# Patient Record
Sex: Female | Born: 1955
Health system: Southern US, Community
[De-identification: ages and names within clinical notes are randomized; demographics above are authoritative.]

## PROBLEM LIST (undated history)

## (undated) DIAGNOSIS — C801 Malignant (primary) neoplasm, unspecified: Secondary | ICD-10-CM

## (undated) DIAGNOSIS — C49A0 Gastrointestinal stromal tumor, unspecified site: Secondary | ICD-10-CM

## (undated) DIAGNOSIS — Q8500 Neurofibromatosis, unspecified: Secondary | ICD-10-CM

## (undated) DIAGNOSIS — E7800 Pure hypercholesterolemia, unspecified: Secondary | ICD-10-CM

## (undated) DIAGNOSIS — K219 Gastro-esophageal reflux disease without esophagitis: Secondary | ICD-10-CM

## (undated) DIAGNOSIS — R51 Headache: Secondary | ICD-10-CM

## (undated) DIAGNOSIS — H5440 Blindness, one eye, unspecified eye: Secondary | ICD-10-CM

## (undated) HISTORY — DX: Gastrointestinal stromal tumor, unspecified site: C49.A0

## (undated) HISTORY — PX: ESOPHAGOGASTRODUODENOSCOPY: SHX1529

## (undated) HISTORY — PX: EYE SURGERY: SHX253

## (undated) HISTORY — DX: Neurofibromatosis, unspecified: Q85.00

## (undated) HISTORY — PX: OTHER SURGICAL HISTORY: SHX169

## (undated) HISTORY — PX: FACIAL RECONSTRUCTION SURGERY: SHX631

---

## 2002-08-05 ENCOUNTER — Encounter: Payer: Self-pay | Admitting: Internal Medicine

## 2002-08-05 ENCOUNTER — Ambulatory Visit (HOSPITAL_COMMUNITY): Admission: RE | Admit: 2002-08-05 | Discharge: 2002-08-05 | Payer: Self-pay | Admitting: Internal Medicine

## 2004-07-21 ENCOUNTER — Other Ambulatory Visit: Admission: RE | Admit: 2004-07-21 | Discharge: 2004-07-21 | Payer: Self-pay | Admitting: Unknown Physician Specialty

## 2004-12-06 ENCOUNTER — Ambulatory Visit (HOSPITAL_COMMUNITY): Admission: RE | Admit: 2004-12-06 | Discharge: 2004-12-06 | Payer: Self-pay | Admitting: Family Medicine

## 2007-01-16 ENCOUNTER — Ambulatory Visit (HOSPITAL_COMMUNITY): Admission: RE | Admit: 2007-01-16 | Discharge: 2007-01-16 | Payer: Self-pay | Admitting: Family Medicine

## 2009-06-29 ENCOUNTER — Ambulatory Visit (HOSPITAL_COMMUNITY): Admission: RE | Admit: 2009-06-29 | Discharge: 2009-06-29 | Payer: Self-pay | Admitting: Family Medicine

## 2009-07-02 ENCOUNTER — Ambulatory Visit (HOSPITAL_COMMUNITY): Admission: RE | Admit: 2009-07-02 | Discharge: 2009-07-02 | Payer: Self-pay | Admitting: Family Medicine

## 2011-04-20 ENCOUNTER — Telehealth: Payer: Self-pay

## 2011-04-20 NOTE — Telephone Encounter (Signed)
Gastroenterology Pre-Procedure Form  Request Date: 04/20/2011       Requesting Physician: Justin Mend, PA-C   PATIENT INFORMATION:  Stephanie [name] is a 55 y.o., female (DOB=1956/06/11).  PROCEDURE: Procedure(s) requested: colonoscopy Procedure Reason: screening for colon cancer  PATIENT REVIEW QUESTIONS: The patient reports the following:   1. Diabetes Melitis: no 2. Joint replacements in the past 12 months: no 3. Major health problems in the past 3 months: no 4. Has an artificial valve or MVP:no 5. Has been advised in past to take antibiotics in advance of a procedure like teeth cleaning: no}    MEDICATIONS & ALLERGIES:    Patient reports the following regarding taking any blood thinners:   Plavix? no Aspirin?yes  Coumadin?  no  Patient confirms/reports the following medications:  Current Outpatient Prescriptions  Medication Sig Dispense Refill  . acetaminophen (TYLENOL) 500 MG tablet Take 500 mg by mouth every 6 (six) hours as needed. As needed       . aspirin 81 MG tablet Take 81 mg by mouth daily.        . Omega-3 Fatty Acids (FISH OIL) 1000 MG CAPS Take by mouth 2 (two) times daily.        . sertraline (ZOLOFT) 50 MG tablet Take 50 mg by mouth daily.        . simvastatin (ZOCOR) 40 MG tablet Take 40 mg by mouth at bedtime.        Marland Kitchen UNKNOWN TO PATIENT Calcium 600 mg plus D    One tablet twice daily         Patient confirms/reports the following allergies:  No Known Allergies  Patient is appropriate to schedule for requested procedure(s): yes  AUTHORIZATION INFORMATION Primary Insurance:  ID #:   Group #:  Pre-Cert / Auth required: Pre-Cert / Auth #:   Secondary Insurance:   ID #:   Group  Pre-Cert / Auth required: Pre-Cert / Auth #:   No orders of the defined types were placed in this encounter.    SCHEDULE INFORMATION: Procedure has been scheduled as follows:  Date: 05/08/2011  Location: [facility] Short Stay This Gastroenterology  Pre-Precedure Form is being routed to the following provider(s) for review: R. Roetta Sessions, MD

## 2011-04-20 NOTE — Telephone Encounter (Signed)
OK for colonoscopy.  

## 2011-04-24 ENCOUNTER — Other Ambulatory Visit: Payer: Self-pay

## 2011-04-24 DIAGNOSIS — Z139 Encounter for screening, unspecified: Secondary | ICD-10-CM

## 2011-04-24 NOTE — Telephone Encounter (Signed)
Rx and instructions mailed.  

## 2011-05-08 ENCOUNTER — Encounter (HOSPITAL_COMMUNITY): Payer: Self-pay | Admitting: *Deleted

## 2011-05-08 ENCOUNTER — Ambulatory Visit (HOSPITAL_COMMUNITY)
Admission: RE | Admit: 2011-05-08 | Discharge: 2011-05-08 | Disposition: A | Discharge status: Home / Self Care | Payer: BC Managed Care – PPO | Source: Ambulatory Visit | Attending: Internal Medicine | Admitting: Internal Medicine

## 2011-05-08 ENCOUNTER — Encounter (HOSPITAL_COMMUNITY)
Admission: RE | Disposition: A | Discharge status: Home / Self Care | Payer: Self-pay | Source: Ambulatory Visit | Attending: Internal Medicine

## 2011-05-08 DIAGNOSIS — E7800 Pure hypercholesterolemia, unspecified: Secondary | ICD-10-CM | POA: Insufficient documentation

## 2011-05-08 DIAGNOSIS — Z1211 Encounter for screening for malignant neoplasm of colon: Secondary | ICD-10-CM

## 2011-05-08 DIAGNOSIS — Z7982 Long term (current) use of aspirin: Secondary | ICD-10-CM | POA: Insufficient documentation

## 2011-05-08 DIAGNOSIS — Z139 Encounter for screening, unspecified: Secondary | ICD-10-CM

## 2011-05-08 HISTORY — DX: Pure hypercholesterolemia, unspecified: E78.00

## 2011-05-08 HISTORY — DX: Gastro-esophageal reflux disease without esophagitis: K21.9

## 2011-05-08 HISTORY — DX: Headache: R51

## 2011-05-08 HISTORY — PX: COLONOSCOPY: SHX5424

## 2011-05-08 SURGERY — COLONOSCOPY
Anesthesia: Moderate Sedation

## 2011-05-08 MED ORDER — MEPERIDINE HCL 100 MG/ML IJ SOLN
INTRAMUSCULAR | Status: DC | PRN
  Administered 2011-05-08: 25 mg via INTRAVENOUS
  Administered 2011-05-08: 50 mg via INTRAVENOUS
  Administered 2011-05-08: 25 mg via INTRAVENOUS

## 2011-05-08 MED ORDER — SODIUM CHLORIDE 0.45 % IV SOLN
Freq: Once | INTRAVENOUS | Status: AC
  Administered 2011-05-08: 10:00:00 via INTRAVENOUS

## 2011-05-08 MED ORDER — MEPERIDINE HCL 100 MG/ML IJ SOLN
INTRAMUSCULAR | Status: AC
  Filled 2011-05-08: qty 2

## 2011-05-08 MED ORDER — MIDAZOLAM HCL 5 MG/5ML IJ SOLN
INTRAMUSCULAR | Status: DC | PRN
  Administered 2011-05-08: 1 mg via INTRAVENOUS
  Administered 2011-05-08 (×2): 2 mg via INTRAVENOUS

## 2011-05-08 MED ORDER — MIDAZOLAM HCL 5 MG/5ML IJ SOLN
INTRAMUSCULAR | Status: AC
  Filled 2011-05-08: qty 10

## 2011-05-08 MED ORDER — STERILE WATER FOR IRRIGATION IR SOLN
Status: DC | PRN
  Administered 2011-05-08: 10:00:00

## 2011-05-08 NOTE — H&P (Signed)
Primary Care Physician:  Cassell Smiles., MD Primary Gastroenterologist:  Dr. Jena Gauss  Pre-Procedure History & Physical: HPI:  Stephanie [name] is a 55 y.o. female is here for a screening colonoscopy. No bowel symptoms. No family history polyps or colon cancer. Had a negative colonoscopy back in 2001  Past Medical History  Diagnosis Date  . Hypercholesteremia   . Headache   . Anemia   . GERD (gastroesophageal reflux disease)   . Depression     Past Surgical History  Procedure Date  . Facial reconstruction surgery     X 5-6 times  . Skin grafts     bilateral legs  . Cesarean section     X 3    Prior to Admission medications   Medication Sig Start Date End Date Taking? Authorizing Provider  acetaminophen (TYLENOL) 500 MG tablet Take 500 mg by mouth every 6 (six) hours as needed. For pain   Yes Historical Provider, MD  aspirin 81 MG tablet Take 81 mg by mouth daily.     Yes Historical Provider, MD  calcium citrate-vitamin D 200-200 MG-UNIT TABS Take 1 tablet by mouth daily.     Yes Historical Provider, MD  Omega-3 Fatty Acids (FISH OIL) 1000 MG CAPS Take by mouth 2 (two) times daily.     Yes Historical Provider, MD  sertraline (ZOLOFT) 50 MG tablet Take 50 mg by mouth daily.     Yes Historical Provider, MD  simvastatin (ZOCOR) 40 MG tablet Take 40 mg by mouth at bedtime.     Yes Historical Provider, MD  UNKNOWN TO PATIENT Calcium 600 mg plus D    One tablet twice daily     Historical Provider, MD    Allergies as of 04/24/2011  . (No Known Allergies)    History reviewed. No pertinent family history.  History   Social History  . Marital Status: Widowed    Spouse Name: N/A    Number of Children: N/A  . Years of Education: N/A   Occupational History  . Not on file.   Social History Main Topics  . Smoking status: Never Smoker   . Smokeless tobacco: Not on file  . Alcohol Use: Yes     occasionally  . Drug Use: No  . Sexually Active:    Other Topics Concern  .  Not on file   Social History Narrative  . No narrative on file    Review of Systems: See HPI, otherwise negative ROS  Physical Exam: BP 116/68  Pulse 70  Temp(Src) 97.9 F (36.6 C) (Oral)  Resp 20  Ht 4\' 11"  (1.499 m)  Wt 121 lb (54.885 kg)  BMI 24.44 kg/m2  SpO2 100% General:   Alert,  Well-developed, well-nourished, pleasant and cooperative in NAD Head: Deformity of left side of face. No left eye   Sclera clear, no icterus.   Conjunctiva pink. Ears:  Normal auditory acuity. Nose:  No deformity, discharge,  or lesions. Mouth:  No deformity or lesions, dentition normal. Neck:  Supple; no masses or thyromegaly. Lungs:  Clear throughout to auscultation.   No wheezes, crackles, or rhonchi. No acute distress. Heart:  Regular rate and rhythm; no murmurs, clicks, rubs,  or gallops. Abdomen:  Soft, nontender and nondistended. No masses, hepatosplenomegaly or hernias noted. Normal bowel sounds, without guarding, and without rebound.   Msk:  Symmetrical without gross deformities. Normal posture. Pulses:  Normal pulses noted. Extremities:  Without clubbing or edema. Neurologic:  Alert and  oriented x4;  grossly normal neurologically. Skin:  Intact without significant lesions or rashes. Cervical Nodes:  No significant cervical adenopathy. Psych:  Alert and cooperative. Normal mood and affect.  Impression/Plan: Stephanie [name] is now here to undergo a screening colonoscopy.  Average risk examination.  Risks, benefits, limitations, imponderables and alternatives regarding colonoscopy have been reviewed with the patient. Questions have been answered. All parties agreeable.

## 2011-05-12 ENCOUNTER — Encounter (HOSPITAL_COMMUNITY): Payer: Self-pay | Admitting: Internal Medicine

## 2011-05-22 ENCOUNTER — Other Ambulatory Visit (HOSPITAL_COMMUNITY): Payer: Self-pay | Admitting: Internal Medicine

## 2011-05-22 DIAGNOSIS — Z139 Encounter for screening, unspecified: Secondary | ICD-10-CM

## 2011-05-25 ENCOUNTER — Ambulatory Visit (HOSPITAL_COMMUNITY): Payer: BC Managed Care – PPO

## 2011-06-15 ENCOUNTER — Ambulatory Visit (HOSPITAL_COMMUNITY)
Admission: RE | Admit: 2011-06-15 | Discharge: 2011-06-15 | Disposition: A | Discharge status: Home / Self Care | Payer: BC Managed Care – PPO | Source: Ambulatory Visit | Attending: Internal Medicine | Admitting: Internal Medicine

## 2011-06-15 DIAGNOSIS — Z1231 Encounter for screening mammogram for malignant neoplasm of breast: Secondary | ICD-10-CM | POA: Insufficient documentation

## 2011-06-15 DIAGNOSIS — Z139 Encounter for screening, unspecified: Secondary | ICD-10-CM

## 2011-06-19 ENCOUNTER — Other Ambulatory Visit (HOSPITAL_COMMUNITY): Payer: Self-pay | Admitting: Internal Medicine

## 2011-06-19 DIAGNOSIS — R921 Mammographic calcification found on diagnostic imaging of breast: Secondary | ICD-10-CM

## 2011-06-19 DIAGNOSIS — Z09 Encounter for follow-up examination after completed treatment for conditions other than malignant neoplasm: Secondary | ICD-10-CM

## 2011-07-12 ENCOUNTER — Ambulatory Visit (HOSPITAL_COMMUNITY)
Admission: RE | Admit: 2011-07-12 | Discharge: 2011-07-12 | Disposition: A | Discharge status: Home / Self Care | Payer: BC Managed Care – PPO | Source: Ambulatory Visit | Attending: Internal Medicine | Admitting: Internal Medicine

## 2011-07-12 DIAGNOSIS — R928 Other abnormal and inconclusive findings on diagnostic imaging of breast: Secondary | ICD-10-CM | POA: Insufficient documentation

## 2011-07-12 DIAGNOSIS — R921 Mammographic calcification found on diagnostic imaging of breast: Secondary | ICD-10-CM

## 2012-05-31 ENCOUNTER — Other Ambulatory Visit (HOSPITAL_COMMUNITY): Payer: Self-pay | Admitting: Internal Medicine

## 2012-05-31 DIAGNOSIS — Z139 Encounter for screening, unspecified: Secondary | ICD-10-CM

## 2012-07-15 ENCOUNTER — Ambulatory Visit (HOSPITAL_COMMUNITY)
Admission: RE | Admit: 2012-07-15 | Discharge: 2012-07-15 | Disposition: A | Discharge status: Home / Self Care | Payer: BC Managed Care – PPO | Source: Ambulatory Visit | Attending: Internal Medicine | Admitting: Internal Medicine

## 2012-07-15 DIAGNOSIS — Z1231 Encounter for screening mammogram for malignant neoplasm of breast: Secondary | ICD-10-CM | POA: Insufficient documentation

## 2012-07-15 DIAGNOSIS — Z139 Encounter for screening, unspecified: Secondary | ICD-10-CM

## 2013-07-01 ENCOUNTER — Other Ambulatory Visit (HOSPITAL_COMMUNITY): Payer: Self-pay | Admitting: Family Medicine

## 2013-07-01 DIAGNOSIS — Z139 Encounter for screening, unspecified: Secondary | ICD-10-CM

## 2013-07-17 ENCOUNTER — Ambulatory Visit (HOSPITAL_COMMUNITY)
Admission: RE | Admit: 2013-07-17 | Discharge: 2013-07-17 | Disposition: A | Discharge status: Home / Self Care | Payer: BC Managed Care – PPO | Source: Ambulatory Visit | Attending: Family Medicine | Admitting: Family Medicine

## 2013-07-17 DIAGNOSIS — Z1231 Encounter for screening mammogram for malignant neoplasm of breast: Secondary | ICD-10-CM | POA: Insufficient documentation

## 2013-07-17 DIAGNOSIS — Z139 Encounter for screening, unspecified: Secondary | ICD-10-CM

## 2013-08-14 DIAGNOSIS — C49A0 Gastrointestinal stromal tumor, unspecified site: Secondary | ICD-10-CM

## 2013-08-14 HISTORY — DX: Gastrointestinal stromal tumor, unspecified site: C49.A0

## 2013-12-02 ENCOUNTER — Ambulatory Visit (INDEPENDENT_AMBULATORY_CARE_PROVIDER_SITE_OTHER): Payer: BC Managed Care – PPO | Admitting: Gastroenterology

## 2013-12-02 ENCOUNTER — Other Ambulatory Visit: Payer: Self-pay | Admitting: Gastroenterology

## 2013-12-02 ENCOUNTER — Encounter (INDEPENDENT_AMBULATORY_CARE_PROVIDER_SITE_OTHER): Payer: Self-pay

## 2013-12-02 ENCOUNTER — Encounter: Payer: Self-pay | Admitting: Gastroenterology

## 2013-12-02 VITALS — BP 116/72 | HR 75 | Temp 97.6°F | Ht 59.0 in | Wt 121.0 lb

## 2013-12-02 DIAGNOSIS — R933 Abnormal findings on diagnostic imaging of other parts of digestive tract: Secondary | ICD-10-CM

## 2013-12-02 DIAGNOSIS — K3189 Other diseases of stomach and duodenum: Secondary | ICD-10-CM

## 2013-12-02 NOTE — Patient Instructions (Signed)
Upper endoscopy with Dr. Gala Romney as scheduled. See separate instructions.
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
## 2013-12-02 NOTE — Assessment & Plan Note (Signed)
58 year-old lady with history of neurofibromatosis who presents with abnormal small bowel on CT scan. This finding was incidental after she underwent abdominal ultrasound for abdominal bruit. Abdominal ultrasound showed abnormality in the right upper quadrant which is felt to be a small mass or lymphadenopathy. Therefore CT scan was done which showed a mass adjacent to or arising from the descending duodenum. Discussed case with Dr. Gala Romney. Plan on EGD for further evaluation. I explained to patient today that if the lesion is outside the duodenum or beyond the reach of the scope she would need to have further workup.  I have discussed the risks, alternatives, benefits with regards to but not limited to the risk of reaction to medication, bleeding, infection, perforation and the patient is agreeable to proceed. Written consent to be obtained.

## 2013-12-03 ENCOUNTER — Encounter (HOSPITAL_COMMUNITY): Payer: Self-pay

## 2013-12-03 NOTE — Progress Notes (Signed)
cc'd to pcp 

## 2013-12-15 ENCOUNTER — Ambulatory Visit (HOSPITAL_COMMUNITY)
Admission: RE | Admit: 2013-12-15 | Discharge: 2013-12-15 | Disposition: A | Discharge status: Home / Self Care | Payer: BC Managed Care – PPO | Source: Ambulatory Visit | Attending: Internal Medicine | Admitting: Internal Medicine

## 2013-12-15 ENCOUNTER — Encounter (HOSPITAL_COMMUNITY)
Admission: RE | Disposition: A | Discharge status: Home / Self Care | Payer: Self-pay | Source: Ambulatory Visit | Attending: Internal Medicine

## 2013-12-15 ENCOUNTER — Encounter (HOSPITAL_COMMUNITY): Payer: Self-pay

## 2013-12-15 DIAGNOSIS — Z7982 Long term (current) use of aspirin: Secondary | ICD-10-CM | POA: Insufficient documentation

## 2013-12-15 DIAGNOSIS — K2210 Ulcer of esophagus without bleeding: Secondary | ICD-10-CM | POA: Insufficient documentation

## 2013-12-15 DIAGNOSIS — E7800 Pure hypercholesterolemia, unspecified: Secondary | ICD-10-CM | POA: Insufficient documentation

## 2013-12-15 DIAGNOSIS — F3289 Other specified depressive episodes: Secondary | ICD-10-CM | POA: Insufficient documentation

## 2013-12-15 DIAGNOSIS — Q393 Congenital stenosis and stricture of esophagus: Secondary | ICD-10-CM

## 2013-12-15 DIAGNOSIS — Z79899 Other long term (current) drug therapy: Secondary | ICD-10-CM | POA: Insufficient documentation

## 2013-12-15 DIAGNOSIS — K319 Disease of stomach and duodenum, unspecified: Secondary | ICD-10-CM

## 2013-12-15 DIAGNOSIS — K3189 Other diseases of stomach and duodenum: Secondary | ICD-10-CM

## 2013-12-15 DIAGNOSIS — K449 Diaphragmatic hernia without obstruction or gangrene: Secondary | ICD-10-CM

## 2013-12-15 DIAGNOSIS — R933 Abnormal findings on diagnostic imaging of other parts of digestive tract: Secondary | ICD-10-CM

## 2013-12-15 DIAGNOSIS — K2100 Gastro-esophageal reflux disease with esophagitis, without bleeding: Secondary | ICD-10-CM

## 2013-12-15 DIAGNOSIS — K222 Esophageal obstruction: Secondary | ICD-10-CM

## 2013-12-15 DIAGNOSIS — Q391 Atresia of esophagus with tracheo-esophageal fistula: Secondary | ICD-10-CM

## 2013-12-15 DIAGNOSIS — F329 Major depressive disorder, single episode, unspecified: Secondary | ICD-10-CM | POA: Insufficient documentation

## 2013-12-15 DIAGNOSIS — Q8500 Neurofibromatosis, unspecified: Secondary | ICD-10-CM | POA: Insufficient documentation

## 2013-12-15 HISTORY — PX: ESOPHAGOGASTRODUODENOSCOPY: SHX5428

## 2013-12-15 SURGERY — EGD (ESOPHAGOGASTRODUODENOSCOPY)
Anesthesia: Moderate Sedation

## 2013-12-15 MED ORDER — ONDANSETRON HCL 4 MG/2ML IJ SOLN
INTRAMUSCULAR | Status: AC
  Filled 2013-12-15: qty 2

## 2013-12-15 MED ORDER — MEPERIDINE HCL 100 MG/ML IJ SOLN
INTRAMUSCULAR | Status: AC
  Filled 2013-12-15: qty 2

## 2013-12-15 MED ORDER — MIDAZOLAM HCL 5 MG/5ML IJ SOLN
INTRAMUSCULAR | Status: DC | PRN
  Administered 2013-12-15: 1 mg via INTRAVENOUS
  Administered 2013-12-15: 2 mg via INTRAVENOUS
  Administered 2013-12-15 (×2): 1 mg via INTRAVENOUS

## 2013-12-15 MED ORDER — SODIUM CHLORIDE 0.9 % IV SOLN
INTRAVENOUS | Status: DC
  Administered 2013-12-15: 13:00:00 via INTRAVENOUS

## 2013-12-15 MED ORDER — ONDANSETRON HCL 4 MG/2ML IJ SOLN
INTRAMUSCULAR | Status: DC | PRN
  Administered 2013-12-15: 4 mg via INTRAVENOUS

## 2013-12-15 MED ORDER — MEPERIDINE HCL 100 MG/ML IJ SOLN
INTRAMUSCULAR | Status: DC | PRN
  Administered 2013-12-15: 50 mg via INTRAVENOUS
  Administered 2013-12-15: 25 mg via INTRAVENOUS

## 2013-12-15 MED ORDER — STERILE WATER FOR IRRIGATION IR SOLN
Status: DC | PRN
  Administered 2013-12-15: 14:00:00

## 2013-12-15 MED ORDER — LIDOCAINE VISCOUS 2 % MT SOLN
OROMUCOSAL | Status: DC | PRN
  Administered 2013-12-15: 3 mL via OROMUCOSAL

## 2013-12-15 MED ORDER — MIDAZOLAM HCL 5 MG/5ML IJ SOLN
INTRAMUSCULAR | Status: AC
  Filled 2013-12-15: qty 10

## 2013-12-15 MED ORDER — LIDOCAINE VISCOUS 2 % MT SOLN
OROMUCOSAL | Status: AC
  Filled 2013-12-15: qty 15

## 2013-12-15 NOTE — Discharge Instructions (Signed)
EGD Discharge instructions Please read the instructions outlined below and refer to this sheet in the next few weeks. These discharge instructions provide you with general information on caring for yourself after you leave the hospital. Your doctor may also give you specific instructions. While your treatment has been planned according to the most current medical practices available, unavoidable complications occasionally occur. If you have any problems or questions after discharge, please call your doctor. ACTIVITY  You may resume your regular activity but move at a slower pace for the next 24 hours.   Take frequent rest periods for the next 24 hours.   Walking will help expel (get rid of) the air and reduce the bloated feeling in your abdomen.   No driving for 24 hours (because of the anesthesia (medicine) used during the test).   You may shower.   Do not sign any important legal documents or operate any machinery for 24 hours (because of the anesthesia used during the test).  NUTRITION  Drink plenty of fluids.   You may resume your normal diet.   Begin with a light meal and progress to your normal diet.   Avoid alcoholic beverages for 24 hours or as instructed by your caregiver.  MEDICATIONS  You may resume your normal medications unless your caregiver tells you otherwise.  WHAT YOU CAN EXPECT TODAY  You may experience abdominal discomfort such as a feeling of fullness or gas pains.  FOLLOW-UP  Your doctor will discuss the results of your test with you.  SEEK IMMEDIATE MEDICAL ATTENTION IF ANY OF THE FOLLOWING OCCUR:  Excessive nausea (feeling sick to your stomach) and/or vomiting.   Severe abdominal pain and distention (swelling).   Trouble swallowing.   Temperature over 101 F (37.8 C).   Rectal bleeding or vomiting of blood.    GERD and hiatal hernia information provided  Begin Dexilant 60 mg daily for GERD  No MRI in the future until clips are no longer  present  Further recommendations to follow pending review of pathology  Gastroesophageal Reflux Disease, Adult Gastroesophageal reflux disease (GERD) happens when acid from your stomach flows up into the esophagus. When acid comes in contact with the esophagus, the acid causes soreness (inflammation) in the esophagus. Over time, GERD may create small holes (ulcers) in the lining of the esophagus. CAUSES  Increased body weight. This puts pressure on the stomach, making acid rise from the stomach into the esophagus. Smoking. This increases acid production in the stomach. Drinking alcohol. This causes decreased pressure in the lower esophageal sphincter (valve or ring of muscle between the esophagus and stomach), allowing acid from the stomach into the esophagus. Late evening meals and a full stomach. This increases pressure and acid production in the stomach. A malformed lower esophageal sphincter. Sometimes, no cause is found. SYMPTOMS  Burning pain in the lower part of the mid-chest behind the breastbone and in the mid-stomach area. This may occur twice a week or more often. Trouble swallowing. Sore throat. Dry cough. Asthma-like symptoms including chest tightness, shortness of breath, or wheezing. DIAGNOSIS  Your caregiver may be able to diagnose GERD based on your symptoms. In some cases, X-rays and other tests may be done to check for complications or to check the condition of your stomach and esophagus. TREATMENT  Your caregiver may recommend over-the-counter or prescription medicines to help decrease acid production. Ask your caregiver before starting or adding any new medicines.  HOME CARE INSTRUCTIONS  Change the factors that you can  control. Ask your caregiver for guidance concerning weight loss, quitting smoking, and alcohol consumption. Avoid foods and drinks that make your symptoms worse, such as: Caffeine or alcoholic drinks. Chocolate. Peppermint or mint flavorings. Garlic  and onions. Spicy foods. Citrus fruits, such as oranges, lemons, or limes. Tomato-based foods such as sauce, chili, salsa, and pizza. Fried and fatty foods. Avoid lying down for the 3 hours prior to your bedtime or prior to taking a nap. Eat small, frequent meals instead of large meals. Wear loose-fitting clothing. Do not wear anything tight around your waist that causes pressure on your stomach. Raise the head of your bed 6 to 8 inches with wood blocks to help you sleep. Extra pillows will not help. Only take over-the-counter or prescription medicines for pain, discomfort, or fever as directed by your caregiver. Do not take aspirin, ibuprofen, or other nonsteroidal anti-inflammatory drugs (NSAIDs). SEEK IMMEDIATE MEDICAL CARE IF:  You have pain in your arms, neck, jaw, teeth, or back. Your pain increases or changes in intensity or duration. You develop nausea, vomiting, or sweating (diaphoresis). You develop shortness of breath, or you faint. Your vomit is green, yellow, black, or looks like coffee grounds or blood. Your stool is red, bloody, or black. These symptoms could be signs of other problems, such as heart disease, gastric bleeding, or esophageal bleeding. MAKE SURE YOU:  Understand these instructions. Will watch your condition. Will get help right away if you are not doing well or get worse.   Hiatal Hernia A hiatal hernia occurs when a part of the stomach slides above the diaphragm. The diaphragm is the thin muscle separating the belly (abdomen) from the chest. A hiatal hernia can be something you are born with or develop over time. Hiatal hernias may allow stomach acid to flow back into your esophagus, the tube which carries food from your mouth to your stomach. If this acid causes problems it is called GERD (gastro-esophageal reflux disease).  SYMPTOMS  Common symptoms of GERD are heartburn (burning in your chest). This is worse when lying down or bending over. It may also  cause belching and indigestion. Some of the things which make GERD worse are:  Increased weight pushes on stomach making acid rise more easily.  Smoking markedly increases acid production.  Alcohol decreases lower esophageal sphincter pressure (valve between stomach and esophagus), allowing acid from stomach into esophagus.  Late evening meals and going to bed with a full stomach increases pressure.  Anything that causes an increase in acid production.  Lower esophageal sphincter incompetence. DIAGNOSIS  Hiatal hernia is often diagnosed with x-rays of your stomach and small bowel. This is called an UGI (upper gastrointestinal x-ray). Sometimes a gastroscopic procedure is done. This is a procedure where your caregiver uses a flexible instrument to look into the stomach and small bowel. HOME CARE INSTRUCTIONS   Try to achieve and maintain an ideal body weight.  Avoid drinking alcoholic beverages.  Stop smoking.  Put the head of your bed on 4 to 6 inch blocks. This will keep your head and esophagus higher than your stomach. If you cannot use blocks, sleep with several pillows under your head and shoulders.  Over-the-counter medications will decrease acid production. Your caregiver can also prescribe medications for this. Take as directed.  1/2 to 1 teaspoon of an antacid taken every hour while awake, with meals and at bedtime, will neutralize acid.  Do not take aspirin, ibuprofen (Advil or Motrin), or other nonsteroidal anti-inflammatory drugs.  Do  not wear tight clothing around your chest or stomach.  Eat smaller meals and eat more frequently. This keeps your stomach from getting too full. Eat slowly.  Do not lie down for 2 or 3 hours after eating. Do not eat or drink anything 1 to 2 hours before going to bed.  Avoid caffeine beverages (colas, coffee, cocoa, tea), fatty foods, citrus fruits and all other foods and drinks that contain acid and that seem to increase the  problems.  Avoid bending over, especially after eating. Also avoid straining during bowel movements or when urinating or lifting things. Anything that increases the pressure in your belly increases the amount of acid that may be pushed up into your esophagus. SEEK IMMEDIATE MEDICAL CARE IF:  There is change in location (pain in arms, neck, jaw, teeth or back) of your pain, or the pain is getting worse.  You also experience nausea, vomiting, sweating (diaphoresis), or shortness of breath.  You develop continual vomiting, vomit blood or coffee ground material, have bright red blood in your stools, or have black tarry stools. Some of these symptoms could signal other problems such as heart disease. MAKE SURE YOU:   Understand these instructions.  Monitor your condition.  Contact your caregiver if you are not doing well or are getting worse.

## 2013-12-15 NOTE — Op Note (Signed)
[facility] [address] [place], [address]   ENDOSCOPY PROCEDURE REPORT  PATIENT: Stephanie [name], Stephanie [name]  MR#: [id number] BIRTHDATE: 1956/02/06 , 85  yrs. old GENDER: Female ENDOSCOPIST: R.  Garfield Cornea, MD FACP FACG REFERRED BY:  Kerin Perna, M.D. PROCEDURE DATE:  12/15/2013 PROCEDURE:     EGD with duodenal biopsy  INDICATIONS:      Duodenal mass seen on CT-asymptomatic; chronic GERD  INFORMED CONSENT:   The risks, benefits, limitations, alternatives and imponderables have been discussed.  The potential for biopsy, esophogeal dilation, etc. have also been reviewed.  Questions have been answered.  All parties agreeable.  Please see the history and physical in the medical record for more information.  MEDICATIONS:    Versed 5 mg IV and Demerol 75 mg IV in divided doses. Zofran 4 mg IV and Xylocaine gel orally  DESCRIPTION OF PROCEDURE:   The MO-[id number] ([id number])  endoscope was introduced through the mouth and advanced to the second portion of the duodenum without difficulty or limitations.  The mucosal surfaces were surveyed very carefully during advancement of the scope and upon withdrawal.  Retroflexion view of the proximal stomach and esophagogastric junction was performed.      FINDINGS:  Single 3 mm distal esophageal ulcer with associated linear erosions superimposed on Schatzki's ring. No Barrett's esophagus. Patulous EG junction. Stomach empty. 3 cm hiatal hernia. Normal gastric mucosa. Patent pylorus. Examination first, second and third portion duodenum revealed a "heart-shaped" 2 x 2 centimeter submucosal mass just beyond the duodenal bulb. Please see above photos  THERAPEUTIC / DIAGNOSTIC MANEUVERS PERFORMED:  Utilizing jumbo biopsy forceps. (2) biopsies of this mass were taken. This precipitated fairly brisk bleeding. Hemostasis achieved with placement of 2 instinct clips.. Blood loss approximately 10 cc.   COMPLICATIONS:   None  IMPRESSION:    Ulcerative reflux esophagitis. Noncritical Schatzki's ring - not manipulated. Hiatal hernia. Duodenal mass -   status post biopsy and clipping.  RECOMMENDATIONS:  Begin Dexilant 60 mg daily. Followup on pathology. No future MRI until clips gone.    _______________________________ R. Garfield Cornea, MD FACP [facility] eSigned:  R. Garfield Cornea, MD FACP [facility] 12/15/2013 2:48 PM     CC:  PATIENT NAME:  Darbi, Chandran MR#: [id number]

## 2013-12-15 NOTE — Interval H&P Note (Signed)
History and Physical Interval Note:  12/15/2013 2:11 PM  Stephanie [name]  has presented today for surgery, with the diagnosis of DUODENAL MASS, ABNORMAL CT OF SMALL BOWEL  The various methods of treatment have been discussed with the patient and family. After consideration of risks, benefits and other options for treatment, the patient has consented to  Procedure(s) with comments: ESOPHAGOGASTRODUODENOSCOPY (EGD) (N/A) - 1:15-moved to [address] to notify pt as a surgical intervention .  The patient's history has been reviewed, patient examined, no change in status, stable for surgery.  I have reviewed the patient's chart and labs.  Questions were answered to the patient's satisfaction.   No change. EGD per plan.The risks, benefits, limitations, alternatives and imponderables have been reviewed with the patient. Potential for esophageal dilation, biopsy, etc. have also been reviewed.  Questions have been answered. All parties agreeable.  Cristopher Estimable Denna Fryberger
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
## 2013-12-16 ENCOUNTER — Telehealth: Payer: Self-pay

## 2013-12-16 NOTE — Telephone Encounter (Signed)
Pt insurance will not cover the [place]. She has never tried Nexium or Protoinx. Can we call something different in for her to the [facility] in [place]. Please advise

## 2013-12-17 MED ORDER — PANTOPRAZOLE SODIUM 40 MG PO TBEC: 40.0000 mg | DELAYED_RELEASE_TABLET | Freq: Every day | ORAL | Status: DC

## 2013-12-17 NOTE — Telephone Encounter (Signed)
rx sent in 

## 2013-12-17 NOTE — Telephone Encounter (Signed)
Protonix 40 mg daily. Dispense 30 with 11 refills

## 2013-12-18 ENCOUNTER — Encounter (HOSPITAL_COMMUNITY): Payer: Self-pay | Admitting: Internal Medicine

## 2013-12-18 NOTE — Telephone Encounter (Signed)
Pt is aware of new Rx

## 2013-12-23 ENCOUNTER — Other Ambulatory Visit: Payer: Self-pay

## 2013-12-23 ENCOUNTER — Encounter: Payer: Self-pay | Admitting: Internal Medicine

## 2013-12-23 DIAGNOSIS — K3189 Other diseases of stomach and duodenum: Secondary | ICD-10-CM

## 2013-12-25 ENCOUNTER — Encounter (HOSPITAL_COMMUNITY): Payer: Self-pay | Admitting: Pharmacy Technician

## 2013-12-26 ENCOUNTER — Encounter (HOSPITAL_COMMUNITY): Payer: Self-pay | Admitting: *Deleted

## 2013-12-31 ENCOUNTER — Encounter: Payer: Self-pay | Admitting: Gastroenterology

## 2014-01-08 ENCOUNTER — Ambulatory Visit (HOSPITAL_COMMUNITY): Payer: BC Managed Care – PPO | Admitting: *Deleted

## 2014-01-08 ENCOUNTER — Encounter (HOSPITAL_COMMUNITY)
Admission: RE | Disposition: A | Discharge status: Home / Self Care | Payer: Self-pay | Source: Ambulatory Visit | Attending: Gastroenterology

## 2014-01-08 ENCOUNTER — Encounter (HOSPITAL_COMMUNITY): Payer: Self-pay | Admitting: Gastroenterology

## 2014-01-08 ENCOUNTER — Encounter (HOSPITAL_COMMUNITY): Payer: BC Managed Care – PPO | Admitting: *Deleted

## 2014-01-08 ENCOUNTER — Ambulatory Visit (HOSPITAL_COMMUNITY)
Admission: RE | Admit: 2014-01-08 | Discharge: 2014-01-08 | Disposition: A | Discharge status: Home / Self Care | Payer: BC Managed Care – PPO | Source: Ambulatory Visit | Attending: Gastroenterology | Admitting: Gastroenterology

## 2014-01-08 DIAGNOSIS — K319 Disease of stomach and duodenum, unspecified: Secondary | ICD-10-CM

## 2014-01-08 DIAGNOSIS — R933 Abnormal findings on diagnostic imaging of other parts of digestive tract: Secondary | ICD-10-CM

## 2014-01-08 DIAGNOSIS — K639 Disease of intestine, unspecified: Secondary | ICD-10-CM | POA: Insufficient documentation

## 2014-01-08 DIAGNOSIS — H5440 Blindness, one eye, unspecified eye: Secondary | ICD-10-CM | POA: Diagnosis not present

## 2014-01-08 DIAGNOSIS — K3189 Other diseases of stomach and duodenum: Secondary | ICD-10-CM

## 2014-01-08 HISTORY — PX: EUS: SHX5427

## 2014-01-08 HISTORY — DX: Blindness, one eye, unspecified eye: H54.40

## 2014-01-08 SURGERY — UPPER ENDOSCOPIC ULTRASOUND (EUS) LINEAR
Anesthesia: Monitor Anesthesia Care

## 2014-01-08 MED ORDER — LACTATED RINGERS IV SOLN
INTRAVENOUS | Status: DC
  Administered 2014-01-08: 11:00:00 via INTRAVENOUS

## 2014-01-08 MED ORDER — SODIUM CHLORIDE 0.9 % IV SOLN: INTRAVENOUS | Status: DC

## 2014-01-08 MED ORDER — KETAMINE HCL 10 MG/ML IJ SOLN
INTRAMUSCULAR | Status: DC | PRN
  Administered 2014-01-08 (×2): 10 mg via INTRAVENOUS

## 2014-01-08 MED ORDER — BUTAMBEN-TETRACAINE-BENZOCAINE 2-2-14 % EX AERO
INHALATION_SPRAY | CUTANEOUS | Status: DC | PRN
  Administered 2014-01-08: 1 via TOPICAL

## 2014-01-08 MED ORDER — LIDOCAINE HCL 1 % IJ SOLN
INTRAMUSCULAR | Status: DC | PRN
  Administered 2014-01-08: 30 mg via INTRADERMAL

## 2014-01-08 MED ORDER — PROPOFOL INFUSION 10 MG/ML OPTIME
INTRAVENOUS | Status: DC | PRN
  Administered 2014-01-08: 140 ug/kg/min via INTRAVENOUS

## 2014-01-08 MED ORDER — LIDOCAINE HCL (CARDIAC) 20 MG/ML IV SOLN
INTRAVENOUS | Status: AC
  Filled 2014-01-08: qty 5

## 2014-01-08 MED ORDER — MIDAZOLAM HCL 5 MG/5ML IJ SOLN
INTRAMUSCULAR | Status: DC | PRN
  Administered 2014-01-08 (×2): 1 mg via INTRAVENOUS

## 2014-01-08 MED ORDER — MIDAZOLAM HCL 2 MG/2ML IJ SOLN
INTRAMUSCULAR | Status: AC
  Filled 2014-01-08: qty 2

## 2014-01-08 MED ORDER — PROPOFOL 10 MG/ML IV BOLUS
INTRAVENOUS | Status: AC
  Filled 2014-01-08: qty 40

## 2014-01-08 NOTE — Anesthesia Postprocedure Evaluation (Signed)
  Anesthesia Post-op Note  Patient: Stephanie [name]  Procedure(s) Performed: Procedure(s) (LRB): UPPER ENDOSCOPIC ULTRASOUND (EUS) LINEAR (N/A)  Patient Location: PACU  Anesthesia Type: MAC  Level of Consciousness: awake and alert   Airway and Oxygen Therapy: Patient Spontanous Breathing  Post-op Pain: mild  Post-op Assessment: Post-op Vital signs reviewed, Patient's Cardiovascular Status Stable, Respiratory Function Stable, Patent Airway and No signs of Nausea or vomiting  Last Vitals:  Filed Vitals:   01/08/14 1250  BP: 135/65  Pulse: 73  Temp:   Resp: 15    Post-op Vital Signs: stable   Complications: No apparent anesthesia complications

## 2014-01-08 NOTE — Transfer of Care (Signed)
Immediate Anesthesia Transfer of Care Note  Patient: Stephanie [name]  Procedure(s) Performed: Procedure(s): UPPER ENDOSCOPIC ULTRASOUND (EUS) LINEAR (N/A)  Patient Location: PACU and Endoscopy Unit  Anesthesia Type:MAC  Level of Consciousness: awake, alert , oriented and patient cooperative  Airway & Oxygen Therapy: Patient Spontanous Breathing and Patient connected to nasal cannula oxygen  Post-op Assessment: Report given to PACU RN, Post -op Vital signs reviewed and stable and Patient moving all extremities  Post vital signs: Reviewed and stable  Complications: No apparent anesthesia complications

## 2014-01-08 NOTE — Discharge Instructions (Signed)

## 2014-01-08 NOTE — H&P (Signed)
  HPI: This is a pleasant woman, outside evaluation ([place] then [facility]) On examination she was noted to have an abdominal bruit. This led to an abdominal ultrasound which subsequently revealed an incidental right upper quadrant nodule measuring 23 x 25 x 42 mm. She subsequently had a CT of the abdomen pelvis with contrast. CT showed 2.9 x 4.1 cm hyperattenuating and somewhat heterogeneous lesion adjacent to and or arising from the descending duodenum. EGD Dr. Gala Romney 3-4 weeks ago found subepithelial mass in duodenum,  Mucosal biopsies non-diagnostic.     Past Medical History  Diagnosis Date  . Hypercholesteremia   . Headache(784.0)   . GERD (gastroesophageal reflux disease)   . Neurofibromatosis   . 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 Facial reconstruction surgery      X 5-6 times  . Skin grafts  age 66    bilateral legs  . Cesarean section      X 3  . Colonoscopy  05/08/2011    [id number] colon  . Esophagogastroduodenoscopy      hiatal hernia per RMR, 2001 or so.  . Eye surgery      neurofibroma removed behind eye  . Esophagogastroduodenoscopy N/A 12/15/2013    Procedure: ESOPHAGOGASTRODUODENOSCOPY (EGD);  Surgeon: Daneil Dolin, MD;  [facility];  Service: Endoscopy;  Laterality: N/A;  1:15-moved to [address] to notify pt  . Neurofibromatosis tumor removed  as child  . Cyst removed from hand Left over 10 yrs ago    No current facility-administered medications for this encounter.    Allergies as of 12/23/2013  . (No Known Allergies)    Family History  Problem Relation Age of Onset  . Colon cancer Neg Hx     History   Social History  . Marital Status: Widowed    Spouse Name: N/A    Number of Children: 2  . Years of Education: N/A   Occupational History  . works at [employer]  . Smoking status: Never Smoker   . Smokeless tobacco: Never Used   . Alcohol Use: Yes     Comment: occasionally  . Drug Use: No  . Sexual Activity: Not on file   Other Topics Concern  . Not on file   Social History Narrative  . No narrative on file      Physical Exam: There were no vitals taken for this visit. Constitutional: generally well-appearing Psychiatric: alert and oriented x3 Abdomen: soft, nontender, nondistended, no obvious ascites, no peritoneal signs, normal bowel sounds     Assessment and plan: 58 y.o. female with duodenal subepthelial lesion  This is probably a GIST. EUS planned for today. She will probably also require surgical resection.

## 2014-01-08 NOTE — Anesthesia Preprocedure Evaluation (Signed)
Anesthesia Evaluation  Patient identified by MRN, date of birth, ID band Patient awake    Reviewed: Allergy & Precautions, H&P , NPO status , Patient's Chart, lab work & pertinent test results  Airway Mallampati: II TM Distance: >3 FB Neck ROM: Full    Dental no notable dental hx. (+) Partial Lower, Upper Dentures   Pulmonary neg pulmonary ROS,  breath sounds clear to auscultation  Pulmonary exam normal       Cardiovascular negative cardio ROS  Rhythm:Regular Rate:Normal     Neuro/Psych neurofibromatosis negative psych ROS   GI/Hepatic negative GI ROS, Neg liver ROS,   Endo/Other  negative endocrine ROS  Renal/GU negative Renal ROS  negative genitourinary   Musculoskeletal negative musculoskeletal ROS (+)   Abdominal   Peds negative pediatric ROS (+)  Hematology negative hematology ROS (+)   Anesthesia Other Findings   Reproductive/Obstetrics negative OB ROS                           Anesthesia Physical Anesthesia Plan  ASA: II  Anesthesia Plan: MAC   Post-op Pain Management:    Induction:   Airway Management Planned: Natural Airway  Additional Equipment:   Intra-op Plan:   Post-operative Plan:   Informed Consent: I have reviewed the patients History and Physical, chart, labs and discussed the procedure including the risks, benefits and alternatives for the proposed anesthesia with the patient or authorized representative who has indicated his/her understanding and acceptance.   Dental advisory given  Plan Discussed with: CRNA  Anesthesia Plan Comments:         Anesthesia Quick Evaluation

## 2014-01-08 NOTE — Op Note (Signed)
[facility] [place], [address]   ENDOSCOPIC ULTRASOUND PROCEDURE REPORT  PATIENT: Stephanie [name], Stephanie [name]  MR#: [id number] BIRTHDATE: 1956/02/14  GENDER: Female ENDOSCOPIST: Milus Banister, MD REFERRED BY:  Garfield Cornea, M.D. PROCEDURE DATE:  01/08/2014 PROCEDURE:   Upper EUS w/FNA ASA CLASS:      Class II INDICATIONS:   incidentally noted (asymptomatic) mass involving distal duodenum (noted on outside [place], then CT, confirmed to be a subepithelial lesion by EGD Dr.  Gala Romney). MEDICATIONS: MAC sedation, administered by CRNA  DESCRIPTION OF PROCEDURE:   After the risks benefits and alternatives of the procedure were  explained, informed consent was obtained. The patient was then placed in the left, lateral, decubitus postion and IV sedation was administered. Throughout the procedure, the patients blood pressure, pulse and oxygen saturations were monitored continuously.  Under direct visualization, the Pentax Linear [id number]  endoscope was introduced through the mouth  and advanced to the second portion of the duodenum .  Water was used as necessary to provide an acoustic interface.  Upon completion of the imaging, water was removed and the patient was sent to the recovery room in satisfactory condition.   Endoscopic findings: 1. There was a subepithelial mass involving second portion of the duodenum (just distal to the duodenal bulb). 2. UGI tract was otherwise normal.  EUS findings: 1. The mass described above correlated with a hypoechic, irregularly shaped, heterogeneous mass that appeared to involve the muscularis propria layer of the descending duodenal wall.  The mass measures 4.2cm maximally.  The mass was sampled with 1 transduodenal and 1 transgastric pass with a 25guage EUS FNA needle. 2. No periportal, perigastric adenopathy. 3. Limited views of pancreas, spleen, liver were all normal.  Impression: 4.2cm subepithelial duodenal mass that  lays immediately distal to the duodenal bulb.  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 I suspect this will prove to be a GIST and at its sizse (4.2cm) she should be referred to surgeon to consider resection.   _______________________________ eSignedMilus Banister, MD 01/08/2014 12:27 PM

## 2014-01-09 ENCOUNTER — Encounter (HOSPITAL_COMMUNITY): Payer: Self-pay | Admitting: Gastroenterology

## 2014-01-13 ENCOUNTER — Telehealth: Payer: Self-pay

## 2014-01-13 ENCOUNTER — Other Ambulatory Visit: Payer: Self-pay

## 2014-01-13 DIAGNOSIS — R933 Abnormal findings on diagnostic imaging of other parts of digestive tract: Secondary | ICD-10-CM

## 2014-01-13 NOTE — Telephone Encounter (Signed)
Message copied by Barron Alvine on Tue Jan 13, 2014  4:26 PM ------      Message from: Aviva Signs      Created: Tue Jan 13, 2014  3:57 PM       Pt is scheduled for June 9 arrive at 10.10am for a 10.30am apt with Dr Barry Dienes.Marland KitchenMarland KitchenThanks Thayer Headings      ----- Message -----         From: Barron Alvine, CMA         Sent: 01/13/2014  11:24 AM           To: Aviva Signs            refer to Dr. Barry Dienes for duodenal GIST, consider resection (4cm       ------

## 2014-01-13 NOTE — Telephone Encounter (Signed)
[facility] to call pt  

## 2014-01-20 ENCOUNTER — Ambulatory Visit (INDEPENDENT_AMBULATORY_CARE_PROVIDER_SITE_OTHER): Payer: BC Managed Care – PPO | Admitting: General Surgery

## 2014-01-20 ENCOUNTER — Encounter (INDEPENDENT_AMBULATORY_CARE_PROVIDER_SITE_OTHER): Payer: Self-pay | Admitting: General Surgery

## 2014-01-20 ENCOUNTER — Telehealth: Payer: Self-pay

## 2014-01-20 VITALS — BP 114/72 | HR 70 | Temp 98.0°F | Resp 18 | Ht <= 58 in | Wt 121.0 lb

## 2014-01-20 DIAGNOSIS — C179 Malignant neoplasm of small intestine, unspecified: Secondary | ICD-10-CM

## 2014-01-20 DIAGNOSIS — C49A3 Gastrointestinal stromal tumor of small intestine: Secondary | ICD-10-CM

## 2014-01-20 NOTE — Telephone Encounter (Signed)
Message copied by Barron Alvine on Tue Jan 20, 2014 11:39 AM ------      Message from: Milus Banister      Created: Tue Jan 20, 2014 11:34 AM       Harue Pribble, can you contact her about repeat EGD to label the site of the GIST lesion with [place] Ink (submucosal injection).  MAC at [facility], thanks.              Dr. Barry Dienes feels this could help in decision of lap vs. Open resection. ------

## 2014-01-20 NOTE — Assessment & Plan Note (Signed)
Patient will need to have this resected. This will need to be a full-thickness resection. If this is on the anterior or lateral portion of the duodenum, we could do this robotically or laparoscopically. However, if it is posterior, we will need to do it open. I have requested that Dr. Ardis Hughs tattoo the stalk of the GI stromal tumor. In that way, when we looked at with the camera, we can assess immediately if we will need to open.    I reviewed the risk of surgery with the patient. I discussed the risk of bleeding, infection, damage to adjacent structures, hernia, weak, prolonged nausea and vomiting, potential need for additional procedures or surgeries, cardiac and pulmonary complications, or death.  The patient understands and wishes to proceed. We will do this on an elective basis.

## 2014-01-20 NOTE — Progress Notes (Signed)
Chief Complaint  Patient presents with  . New Evaluation    GIST    HISTORY: Patient is a 58 year old female is seen in consultation at the request of Dr. Ardis Hughs for evaluation of a GI stromal tumor in her duodenum. She presented with left chest pain to her primary care physician in January of this year. She underwent a cardiac workup which was negative. She also had a stress test. Her primary care doctor heard a bruit in her abdomen and ordered an ultrasound.  This demonstrated an abdominal mass and she underwent a CT scan. She subsequently had on endoscopy record work showing a Schatzki's ring and a duodenal mass. Dr. Ardis Hughs performed an endoscopic ultrasound and noted that this was in the submucosal and muscular location of the duodenal wall. This was not obstructing. It was quite friable.  Biopsies were nondiagnostic. She denies any early satiety, nausea, or vomiting. She denies any hematemesis.   Past Medical History  Diagnosis Date  . Hypercholesteremia   . Headache(784.0)   . GERD (gastroesophageal reflux disease)   . Neurofibromatosis   . 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 Facial reconstruction surgery      X 5-6 times  . Skin grafts  age 60    bilateral legs  . Cesarean section      X 3  . Colonoscopy  05/08/2011    [id number] colon  . Esophagogastroduodenoscopy      hiatal hernia per RMR, 2001 or so.  . Eye surgery      neurofibroma removed behind eye  . Esophagogastroduodenoscopy N/A 12/15/2013    Procedure: ESOPHAGOGASTRODUODENOSCOPY (EGD);  Surgeon: Daneil Dolin, MD;  [facility];  Service: Endoscopy;  Laterality: N/A;  1:15-moved to [address] to notify pt  . Neurofibromatosis tumor removed  as child  . Cyst removed from hand Left over 10 yrs ago  . Eus N/A 01/08/2014    Procedure: UPPER ENDOSCOPIC ULTRASOUND (EUS) LINEAR;  Surgeon: Milus Banister, MD;  Location: [facility];  Service: Endoscopy;   Laterality: N/A;    Current Outpatient Prescriptions  Medication Sig Dispense Refill  . acetaminophen (TYLENOL) 500 MG tablet Take 1,000 mg by mouth every 6 (six) hours as needed. For pain      . aspirin 81 MG tablet Take 81 mg by mouth every evening.       Marland Kitchen atorvastatin (LIPITOR) 80 MG tablet Take 40 mg by mouth every evening.       Marland Kitchen CALCIUM-VITAMIN D PO Take 1 tablet by mouth 2 (two) times daily.       Marland Kitchen ibuprofen (ADVIL,MOTRIN) 200 MG tablet Take 400 mg by mouth every 6 (six) hours as needed for moderate pain.      Marland Kitchen loratadine (CLARITIN) 10 MG tablet Take 10 mg by mouth daily.      . pantoprazole (PROTONIX) 40 MG tablet Take 40 mg by mouth every morning.       . Polyvinyl Alcohol-Povidone (REFRESH OP) Place 1 drop into the right eye daily as needed (dry eyes).       No current facility-administered medications for this visit.     No Known Allergies   Family History  Problem Relation Age of Onset  . Colon cancer Neg Hx      History   Social History  . Marital Status: Widowed    Spouse Name: N/A    Number of  Children: 2  . Years of Education: N/A   Occupational History  . works at [employer]  . Smoking status: Never Smoker   . Smokeless tobacco: Never Used  . Alcohol Use: Yes     Comment: occasionally  . Drug Use: No  . Sexual Activity: None   Other Topics Concern  . None   Social History Narrative  . None     REVIEW OF SYSTEMS - PERTINENT POSITIVES ONLY: 12 point review of systems negative other than HPI and PMH except for headaches and visual disturbances.   EXAM: Filed Vitals:   01/20/14 1042  BP: 114/72  Pulse: 70  Temp: 98 F (36.7 C)  Resp: 18    Wt Readings from Last 3 Encounters:  01/20/14 121 lb (54.885 kg)  01/08/14 123 lb (55.792 kg)  01/08/14 123 lb (55.792 kg)     Gen:  No acute distress.  Well nourished and well groomed.   Neurological: Alert and oriented to person, place, and  time. Coordination normal.  Head: Normocephalic and atraumatic.  Eyes: Conjunctivae are normal. Right pupil is round, and reactive to light. No scleral icterus. left eye is surgically absent with reconstruction.   Neck: Normal range of motion. Neck supple. No tracheal deviation or thyromegaly present.  Cardiovascular: Normal rate, regular rhythm, normal heart sounds and intact distal pulses.  Exam reveals no gallop and no friction rub.  No murmur heard. Respiratory: Effort normal.  No respiratory distress. No chest wall tenderness. Breath sounds normal.  No wheezes, rales or rhonchi.  GI: Soft. Bowel sounds are normal. The abdomen is soft and nontender.  There is no rebound and no guarding.  Musculoskeletal: Normal range of motion. Extremities are nontender.  Lymphadenopathy: No cervical, preauricular, postauricular or axillary adenopathy is present Skin: Skin is warm and dry. No rash noted. No diaphoresis. No erythema. No pallor. No clubbing, cyanosis, or edema.  Numerous neurofibromas present throughout. Psychiatric: Normal mood and affect. Behavior is normal. Judgment and thought content normal.    LABORATORY RESULTS: Available labs are reviewed   No results found for this or any previous visit (from the past 2160 hour(s)). surg path Diagnosis Small Intestine Biopsy, nodule/mass PEPTIC DUODENITIS. NO VILLOUS ATROPHY, HELICOBACTER PYLORI OR MALIGNANCY IDENTIFIED  Cytology for EUS Diagnosis FINE NEEDLE ASPIRATION: NEEDLE ASPIRATION, ENDOSCOPIC DUODENAL (SPECIMEN 1 OF 1 COLLECTED ON 01/08/14) SPINDLE CELL PROLIFERATION, SEE COMMENT.   RADIOLOGY RESULTS: See E-Chart or I-Site for most recent results.  Images and reports are reviewed.  Ct at [place].  hyperattenuating mass from descending duodenum.      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 This will need to be a full-thickness resection.  If this is on the anterior or lateral portion of the duodenum, we could do this robotically or laparoscopically. However, if it is posterior, we will need to do it open. I have requested that Dr. Ardis Hughs tattoo the stalk of the GI stromal tumor. In that way, when we looked at with the camera, we can assess immediately if we will need to open.    I reviewed the risk of surgery with the patient. I discussed the risk of bleeding, infection, damage to adjacent structures, hernia, weak, prolonged nausea and vomiting, potential need for additional procedures or surgeries, cardiac and pulmonary complications, or death.  The patient understands and wishes to proceed. We will do this on an elective  basis.     Milus Height MD Surgical Oncology, General and [facility]      Visit Diagnoses: 1. 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

## 2014-01-20 NOTE — Patient Instructions (Signed)
Will set up a endoscopy with a tattoo at the base of the mass.  Then will set up surgery.    Will do diagnostic laparoscopy, possible minimally invasive (robot or laparoscopy depending on schedule) partial duodenectomy vs open removal of tumor.  Main risks are bleeding, infection, damage to adjacent structures, need for additional procedures or surgery, possible prolonged nausea or vomiting, possible blood clot, possible heart or lung issues, possible death.    You will need to be in the hospital 3-7 days.    You will have a tube in your nose for around 2 days and not be able to eat while that is going on.

## 2014-01-21 NOTE — Telephone Encounter (Signed)
The pt has been scheduled for a previsit and EGD she is aware

## 2014-01-22 ENCOUNTER — Ambulatory Visit (AMBULATORY_SURGERY_CENTER): Payer: BC Managed Care – PPO

## 2014-01-22 VITALS — Ht 59.0 in | Wt 120.0 lb

## 2014-01-22 DIAGNOSIS — K639 Disease of intestine, unspecified: Secondary | ICD-10-CM

## 2014-01-22 NOTE — Progress Notes (Signed)
No allergies to eggs or soy No past problems with anesthesia No home oxygen No diet/weight loss meds Has  Emmi instructions given for endoscopy

## 2014-01-26 ENCOUNTER — Encounter (HOSPITAL_COMMUNITY): Payer: Self-pay | Admitting: Pharmacy Technician

## 2014-01-28 ENCOUNTER — Encounter: Payer: Self-pay | Admitting: Gastroenterology

## 2014-01-28 ENCOUNTER — Ambulatory Visit (AMBULATORY_SURGERY_CENTER): Payer: BC Managed Care – PPO | Admitting: Gastroenterology

## 2014-01-28 VITALS — BP 126/72 | HR 82 | Temp 99.1°F | Resp 21 | Ht 59.0 in | Wt 120.0 lb

## 2014-01-28 DIAGNOSIS — K639 Disease of intestine, unspecified: Secondary | ICD-10-CM

## 2014-01-28 DIAGNOSIS — C49A3 Gastrointestinal stromal tumor of small intestine: Secondary | ICD-10-CM

## 2014-01-28 HISTORY — PX: ESOPHAGOGASTRODUODENOSCOPY: SHX1529

## 2014-01-28 MED ORDER — SODIUM CHLORIDE 0.9 % IV SOLN: 500.0000 mL | INTRAVENOUS | Status: DC

## 2014-01-28 NOTE — Progress Notes (Signed)
Called to room to assist during endoscopic procedure.  Patient ID and intended procedure confirmed with present staff. Received instructions for my participation in the procedure from the performing physician.  

## 2014-01-28 NOTE — Patient Instructions (Signed)
Discharge instructions given with verbal understanding. Resume previous medications. YOU HAD AN ENDOSCOPIC PROCEDURE TODAY AT THE [facility]: Refer to the procedure report that was given to you for any specific questions about what was found during the examination.  If the procedure report does not answer your questions, please call your gastroenterologist to clarify.  If you requested that your care partner not be given the details of your procedure findings, then the procedure report has been included in a sealed envelope for you to review at your convenience later.  YOU SHOULD EXPECT: Some feelings of bloating in the abdomen. Passage of more gas than usual.  Walking can help get rid of the air that was put into your GI tract during the procedure and reduce the bloating. If you had a lower endoscopy (such as a colonoscopy or flexible sigmoidoscopy) you may notice spotting of blood in your stool or on the toilet paper. If you underwent a bowel prep for your procedure, then you may not have a normal bowel movement for a few days.  DIET: Your first meal following the procedure should be a light meal and then it is ok to progress to your normal diet.  A half-sandwich or bowl of soup is an example of a good first meal.  Heavy or fried foods are harder to digest and may make you feel nauseous or bloated.  Likewise meals heavy in dairy and vegetables can cause extra gas to form and this can also increase the bloating.  Drink plenty of fluids but you should avoid alcoholic beverages for 24 hours.  ACTIVITY: Your care partner should take you home directly after the procedure.  You should plan to take it easy, moving slowly for the rest of the day.  You can resume normal activity the day after the procedure however you should NOT DRIVE or use heavy machinery for 24 hours (because of the sedation medicines used during the test).    SYMPTOMS TO REPORT IMMEDIATELY: A gastroenterologist can be reached  at any hour.  During normal business hours, 8:30 AM to 5:00 PM Monday through Friday, call [phone].  After hours and on weekends, please call the GI answering service at [phone] who will take a message and have the physician on call contact you.   Following upper endoscopy (EGD)  Vomiting of blood or coffee ground material  New chest pain or pain under the shoulder blades  Painful or persistently difficult swallowing  New shortness of breath  Fever of 100F or higher  Black, tarry-looking stools  FOLLOW UP: If any biopsies were taken you will be contacted by phone or by letter within the next 1-3 weeks.  Call your gastroenterologist if you have not heard about the biopsies in 3 weeks.  Our staff will call the home number listed on your records the next business day following your procedure to check on you and address any questions or concerns that you may have at that time regarding the information given to you following your procedure. This is a courtesy call and so if there is no answer at the home number and we have not heard from you through the emergency physician on call, we will assume that you have returned to your regular daily activities without incident.  SIGNATURES/CONFIDENTIALITY: You and/or your care partner have signed paperwork which will be entered into your electronic medical record.  These signatures attest to the fact that that the information above on your After Visit Summary   has been reviewed and is understood.  Full responsibility of the confidentiality of this discharge information lies with you and/or your care-partner. 

## 2014-01-28 NOTE — Progress Notes (Signed)
Procedure ends, to recovery, report given and VSS. 

## 2014-01-28 NOTE — Op Note (Signed)
[facility]  [address] [place], [id number]   ENDOSCOPY PROCEDURE REPORT  PATIENT: Stephanie [name], Stephanie [name]  MR#: [id number] BIRTHDATE: 20-Jun-1956 , 36  yrs. 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 MEDICATIONS: MAC sedation, administered by CRNA and Propofol (Diprivan) 140 mg IV TOPICAL ANESTHETIC: none  DESCRIPTION OF PROCEDURE: After the risks benefits and alternatives of the procedure were thoroughly explained, informed consent was obtained.  The LB GIF-H180 Loaner [id number] endoscope was introduced through the mouth and advanced to the second portion of the duodenum. Without limitations.  The instrument was slowly withdrawn as the mucosa was fully examined.    The previously noted duodenal submucosal lesion was again identified just distal to the duodenal bulb.  This was about 4cm, normal overlying mucosa.  I injected 1cc of SPOT at the base of the mass to aid in upcoming surgical approach.  Thin Schatzki's ring also noted.  The examination was otherwise normal.  Retroflexed views revealed no abnormalities.     The scope was then withdrawn from the patient and the procedure completed. COMPLICATIONS: There were no complications.  ENDOSCOPIC IMPRESSION: The previously noted duodenal submucosal lesion was again identified just distal to the duodenal bulb.  This was about 4cm, normal overlying mucosa.  I injected 1cc of SPOT at the base of the mass to aid in upcoming surgical approach.  Thin Schatzki's ring also noted.  The examination was otherwise normal.  RECOMMENDATIONS: Proceed with surgery with Dr.  Barry Dienes.   eSigned:  Milus Banister, MD 01/28/2014 3:44 PM

## 2014-01-29 ENCOUNTER — Telehealth: Payer: Self-pay

## 2014-01-29 NOTE — Telephone Encounter (Signed)
  Follow up Call-  Call back number 01/28/2014  Post procedure Call Back phone  # [phone]  Permission to leave phone message Yes     Patient questions:  Do you have a fever, pain , or abdominal swelling? no Pain Score  0 *  Have you tolerated food without any problems? yes  Have you been able to return to your normal activities? yes  Do you have any questions about your discharge instructions: Diet   no Medications  no Follow up visit  no  Do you have questions or concerns about your Care? no  Actions: * If pain score is 4 or above: No action needed, pain <4.

## 2014-02-02 ENCOUNTER — Encounter (HOSPITAL_COMMUNITY): Payer: Self-pay

## 2014-02-02 ENCOUNTER — Encounter (HOSPITAL_COMMUNITY)
Admission: RE | Admit: 2014-02-02 | Discharge: 2014-02-02 | Disposition: A | Discharge status: Home / Self Care | Payer: BC Managed Care – PPO | Source: Ambulatory Visit | Attending: General Surgery | Admitting: General Surgery

## 2014-02-02 DIAGNOSIS — Z01818 Encounter for other preprocedural examination: Secondary | ICD-10-CM | POA: Insufficient documentation

## 2014-02-02 DIAGNOSIS — Z01812 Encounter for preprocedural laboratory examination: Secondary | ICD-10-CM | POA: Insufficient documentation

## 2014-02-02 HISTORY — DX: Malignant (primary) neoplasm, unspecified: C80.1

## 2014-02-02 LAB — CBC WITH DIFFERENTIAL/PLATELET
Basophils Absolute: 0.1 10*3/uL (ref 0.0–0.1)
Basophils Relative: 1 % (ref 0–1)
EOS ABS: 0.2 10*3/uL (ref 0.0–0.7)
Eosinophils Relative: 2 % (ref 0–5)
HEMATOCRIT: 44 % (ref 36.0–46.0)
Hemoglobin: 14.5 g/dL (ref 12.0–15.0)
LYMPHS PCT: 23 % (ref 12–46)
Lymphs Abs: 1.6 10*3/uL (ref 0.7–4.0)
MCH: 28.4 pg (ref 26.0–34.0)
MCHC: 33 g/dL (ref 30.0–36.0)
MCV: 86.3 fL (ref 78.0–100.0)
MONO ABS: 0.3 10*3/uL (ref 0.1–1.0)
Monocytes Relative: 5 % (ref 3–12)
Neutro Abs: 4.7 10*3/uL (ref 1.7–7.7)
Neutrophils Relative %: 69 % (ref 43–77)
Platelets: 215 10*3/uL (ref 150–400)
RBC: 5.1 MIL/uL (ref 3.87–5.11)
RDW: 12.6 % (ref 11.5–15.5)
WBC: 6.7 10*3/uL (ref 4.0–10.5)

## 2014-02-02 LAB — COMPREHENSIVE METABOLIC PANEL
ALT: 15 U/L (ref 0–35)
AST: 16 U/L (ref 0–37)
Albumin: 4.4 g/dL (ref 3.5–5.2)
Alkaline Phosphatase: 102 U/L (ref 39–117)
BUN: 9 mg/dL (ref 6–23)
CALCIUM: 10 mg/dL (ref 8.4–10.5)
CO2: 29 meq/L (ref 19–32)
CREATININE: 0.61 mg/dL (ref 0.50–1.10)
Chloride: 103 mEq/L (ref 96–112)
GLUCOSE: 90 mg/dL (ref 70–99)
Potassium: 5.1 mEq/L (ref 3.7–5.3)
Sodium: 142 mEq/L (ref 137–147)
TOTAL PROTEIN: 7.6 g/dL (ref 6.0–8.3)
Total Bilirubin: 0.4 mg/dL (ref 0.3–1.2)

## 2014-02-02 LAB — URINALYSIS, ROUTINE W REFLEX MICROSCOPIC
Bilirubin Urine: NEGATIVE
GLUCOSE, UA: NEGATIVE mg/dL
HGB URINE DIPSTICK: NEGATIVE
KETONES UR: NEGATIVE mg/dL
Leukocytes, UA: NEGATIVE
Nitrite: NEGATIVE
PROTEIN: NEGATIVE mg/dL
Specific Gravity, Urine: 1.013 (ref 1.005–1.030)
Urobilinogen, UA: 0.2 mg/dL (ref 0.0–1.0)
pH: 7 (ref 5.0–8.0)

## 2014-02-02 LAB — PROTIME-INR
INR: 0.94 (ref 0.00–1.49)
Prothrombin Time: 12.4 seconds (ref 11.6–15.2)

## 2014-02-02 LAB — ABO/RH: ABO/RH(D): O NEG

## 2014-02-02 NOTE — Pre-Procedure Instructions (Signed)
02-02-14 EKG 3'15, CT abd/pelvis with lung bases clear 5'15 Epic

## 2014-02-02 NOTE — Patient Instructions (Addendum)
20 ADEL NEYER  02/02/2014   Your procedure is scheduled on:  6-30 -2015  Enter through [facility]tr[facility] and follow signs to [facility]. Arrive at  0700      AM.  Call this number if you have problems the morning of surgery: [phone]  Or Presurgical Testing [phone](Shelaine Frie) For Living Will and/or Health Care Power Attorney Forms: please provide copy for your medical record,may bring AM of surgery(Forms should be already notarized -we do not provide this service).(02-02-14 No information preferred today-has information already not notarized yet).  Remember: Follow any bowel prep instructions per MD office.   Do not eat food:After Midnight.    Take these medicines the morning of surgery with A SIP OF WATER: Loratadine. Pantoprazole. Bring eye drops.   Do not wear jewelry, make-up or nail polish.  Do not wear lotions, powders, or perfumes. You may wear deodorant.  Do not shave 48 hours(2 days) prior to first CHG shower(legs and under arms).(Shaving face and neck okay.)  Do not bring valuables to the hospital.(Hospital is not responsible for lost valuables).  Contacts, dentures or removable bridgework, body piercing, hair pins may not be worn into surgery.  Leave suitcase in the car. After surgery it may be brought to your room.  For patients admitted to the hospital, checkout time is 11:00 AM the day of discharge.(Restricted visitors-Any Persons displaying flu-like symptoms or illness).    Patients discharged the day of surgery will not be allowed to drive home. Must have responsible person with you x 24 hours once discharged.  Name and phone number of your driver: Dara Hoyer, daughter [phone] cell  Special Instructions: CHG(Chlorhedine 4%-"Hibiclens","Betasept","Aplicare") Environmental consultant Wash: see special instructions.(avoid face and genitals)     Remember : Type/Screen "Blue armbands" - may not be removed once applied(would result in being  retested AM of surgery, if removed).  Failure to follow these instructions may result in Cancellation of your surgery.   _____________________    [facility] - Preparing for Surgery Before surgery, you can play an important role.  Because skin is not sterile, your skin needs to be as free of germs as possible.  You can reduce the number of germs on your skin by washing with CHG (chlorahexidine gluconate) soap before surgery.  CHG is an antiseptic cleaner which kills germs and bonds with the skin to continue killing germs even after washing. Please DO NOT use if you have an allergy to CHG or antibacterial soaps.  If your skin becomes reddened/irritated stop using the CHG and inform your nurse when you arrive at Short Stay. Do not shave (including legs and underarms) for at least 48 hours prior to the first CHG shower.  You may shave your face/neck. Please follow these instructions carefully:  1.  Shower with CHG Soap the night before surgery and the  morning of Surgery.  2.  If you choose to wash your hair, wash your hair first as usual with your  normal  shampoo.  3.  After you shampoo, rinse your hair and body thoroughly to remove the  shampoo.                           4.  Use CHG as you would any other liquid soap.  You can apply chg directly  to the skin and wash  Gently with a scrungie or clean washcloth.  5.  Apply the CHG Soap to your body ONLY FROM THE NECK DOWN.   Do not use on face/ open                           Wound or open sores. Avoid contact with eyes, ears mouth and genitals (private parts).                       Wash face,  Genitals (private parts) with your normal soap.             6.  Wash thoroughly, paying special attention to the area where your surgery  will be performed.  7.  Thoroughly rinse your body with warm water from the neck down.  8.  DO NOT shower/wash with your normal soap after using and rinsing off  the CHG Soap.                9.  Pat  yourself dry with a clean towel.            10.  Wear clean pajamas.            11.  Place clean sheets on your bed the night of your first shower and do not  sleep with pets. Day of Surgery : Do not apply any lotions/deodorants the morning of surgery.  Please wear clean clothes to the hospital/surgery center.  FAILURE TO FOLLOW THESE INSTRUCTIONS MAY RESULT IN THE CANCELLATION OF YOUR SURGERY PATIENT SIGNATURE_________________________________  NURSE SIGNATURE__________________________________  ________________________________________________________________________

## 2014-02-10 ENCOUNTER — Encounter (HOSPITAL_COMMUNITY)
Admission: RE | Disposition: A | Discharge status: Home / Self Care | Payer: Self-pay | Source: Ambulatory Visit | Attending: General Surgery

## 2014-02-10 ENCOUNTER — Inpatient Hospital Stay (HOSPITAL_COMMUNITY): Payer: BC Managed Care – PPO | Admitting: Anesthesiology

## 2014-02-10 ENCOUNTER — Inpatient Hospital Stay (HOSPITAL_COMMUNITY)
Admission: RE | Admit: 2014-02-10 | Discharge: 2014-02-15 | DRG: 331 | Disposition: A | Discharge status: Home / Self Care | Payer: BC Managed Care – PPO | Source: Ambulatory Visit | Attending: General Surgery | Admitting: General Surgery

## 2014-02-10 ENCOUNTER — Encounter (HOSPITAL_COMMUNITY): Payer: Self-pay | Admitting: *Deleted

## 2014-02-10 ENCOUNTER — Encounter (HOSPITAL_COMMUNITY): Payer: BC Managed Care – PPO | Admitting: Anesthesiology

## 2014-02-10 DIAGNOSIS — Q430 Meckel's diverticulum (displaced) (hypertrophic): Secondary | ICD-10-CM

## 2014-02-10 DIAGNOSIS — R933 Abnormal findings on diagnostic imaging of other parts of digestive tract: Secondary | ICD-10-CM

## 2014-02-10 DIAGNOSIS — C494 Malignant neoplasm of connective and soft tissue of abdomen: Secondary | ICD-10-CM

## 2014-02-10 DIAGNOSIS — E7800 Pure hypercholesterolemia, unspecified: Secondary | ICD-10-CM | POA: Diagnosis present

## 2014-02-10 DIAGNOSIS — Z7982 Long term (current) use of aspirin: Secondary | ICD-10-CM

## 2014-02-10 DIAGNOSIS — H5440 Blindness, one eye, unspecified eye: Secondary | ICD-10-CM | POA: Diagnosis present

## 2014-02-10 DIAGNOSIS — Z79899 Other long term (current) drug therapy: Secondary | ICD-10-CM

## 2014-02-10 DIAGNOSIS — Z01812 Encounter for preprocedural laboratory examination: Secondary | ICD-10-CM

## 2014-02-10 DIAGNOSIS — C179 Malignant neoplasm of small intestine, unspecified: Principal | ICD-10-CM | POA: Diagnosis present

## 2014-02-10 DIAGNOSIS — K219 Gastro-esophageal reflux disease without esophagitis: Secondary | ICD-10-CM | POA: Diagnosis present

## 2014-02-10 DIAGNOSIS — C49A3 Gastrointestinal stromal tumor of small intestine: Secondary | ICD-10-CM

## 2014-02-10 DIAGNOSIS — Z5331 Laparoscopic surgical procedure converted to open procedure: Secondary | ICD-10-CM

## 2014-02-10 HISTORY — PX: LAPAROSCOPIC SMALL BOWEL RESECTION: SHX5929

## 2014-02-10 LAB — CBC
HCT: 44.1 % (ref 36.0–46.0)
Hemoglobin: 14.2 g/dL (ref 12.0–15.0)
MCH: 28.1 pg (ref 26.0–34.0)
MCHC: 32.2 g/dL (ref 30.0–36.0)
MCV: 87.2 fL (ref 78.0–100.0)
PLATELETS: 195 10*3/uL (ref 150–400)
RBC: 5.06 MIL/uL (ref 3.87–5.11)
RDW: 12.9 % (ref 11.5–15.5)
WBC: 19.1 10*3/uL — ABNORMAL HIGH (ref 4.0–10.5)

## 2014-02-10 LAB — CREATININE, SERUM
Creatinine, Ser: 0.55 mg/dL (ref 0.50–1.10)
GFR calc Af Amer: >90 mL/min (ref >90)
GFR calc non Af Amer: >90 mL/min (ref >90)

## 2014-02-10 LAB — GLUCOSE, CAPILLARY: GLUCOSE-CAPILLARY: 157 mg/dL — AB (ref 70–99)

## 2014-02-10 LAB — TYPE AND SCREEN
ABO/RH(D): O NEG
ANTIBODY SCREEN: NEGATIVE

## 2014-02-10 SURGERY — EXCISION, SMALL INTESTINE, LAPAROSCOPIC
Anesthesia: General | Site: Abdomen

## 2014-02-10 MED ORDER — DEXTROSE 5 % IV SOLN
2.0000 g | INTRAVENOUS | Status: AC
  Administered 2014-02-10: 2 g via INTRAVENOUS

## 2014-02-10 MED ORDER — PANTOPRAZOLE SODIUM 40 MG IV SOLR
40.0000 mg | Freq: Every day | INTRAVENOUS | Status: DC
Start: 2014-02-10 — End: 2014-02-15
  Administered 2014-02-10 – 2014-02-14 (×5): 40 mg via INTRAVENOUS
  Filled 2014-02-10 (×6): qty 40

## 2014-02-10 MED ORDER — DEXAMETHASONE SODIUM PHOSPHATE 10 MG/ML IJ SOLN
INTRAMUSCULAR | Status: DC | PRN
  Administered 2014-02-10: 10 mg via INTRAVENOUS

## 2014-02-10 MED ORDER — ACETAMINOPHEN 10 MG/ML IV SOLN
1000.0000 mg | Freq: Four times a day (QID) | INTRAVENOUS | Status: AC
  Administered 2014-02-10 – 2014-02-11 (×4): 1000 mg via INTRAVENOUS
  Filled 2014-02-10 (×5): qty 100

## 2014-02-10 MED ORDER — GLYCOPYRROLATE 0.2 MG/ML IJ SOLN
INTRAMUSCULAR | Status: DC | PRN
  Administered 2014-02-10: 0.6 mg via INTRAVENOUS

## 2014-02-10 MED ORDER — POLYVINYL ALCOHOL 1.4 % OP SOLN
1.0000 [drp] | Freq: Two times a day (BID) | OPHTHALMIC | Status: DC
  Administered 2014-02-10 – 2014-02-15 (×10): 1 [drp] via OPHTHALMIC
  Filled 2014-02-10 (×2): qty 15

## 2014-02-10 MED ORDER — HYDROMORPHONE HCL PF 1 MG/ML IJ SOLN
INTRAMUSCULAR | Status: AC
  Filled 2014-02-10: qty 1

## 2014-02-10 MED ORDER — ONDANSETRON HCL 4 MG/2ML IJ SOLN: 4.0000 mg | Freq: Four times a day (QID) | INTRAMUSCULAR | Status: DC | PRN

## 2014-02-10 MED ORDER — MIDAZOLAM HCL 5 MG/5ML IJ SOLN
INTRAMUSCULAR | Status: DC | PRN
  Administered 2014-02-10 (×2): 1 mg via INTRAVENOUS

## 2014-02-10 MED ORDER — ROCURONIUM BROMIDE 100 MG/10ML IV SOLN
INTRAVENOUS | Status: DC | PRN
  Administered 2014-02-10: 5 mg via INTRAVENOUS
  Administered 2014-02-10: 40 mg via INTRAVENOUS
  Administered 2014-02-10 (×2): 10 mg via INTRAVENOUS

## 2014-02-10 MED ORDER — LIDOCAINE HCL 1 % IJ SOLN
INTRAMUSCULAR | Status: DC | PRN
  Administered 2014-02-10: 4 mL

## 2014-02-10 MED ORDER — DEXTROSE 5 % IV SOLN
INTRAVENOUS | Status: AC
  Filled 2014-02-10: qty 2

## 2014-02-10 MED ORDER — BUPIVACAINE-EPINEPHRINE 0.25% -1:200000 IJ SOLN
INTRAMUSCULAR | Status: DC | PRN
  Administered 2014-02-10: 4 mL

## 2014-02-10 MED ORDER — GLYCOPYRROLATE 0.2 MG/ML IJ SOLN
INTRAMUSCULAR | Status: AC
  Filled 2014-02-10: qty 3

## 2014-02-10 MED ORDER — BUPIVACAINE 0.25 % ON-Q PUMP DUAL CATH 300 ML
300.0000 mL | INJECTION | Status: DC
  Administered 2014-02-10: 300 mL
  Filled 2014-02-10: qty 300

## 2014-02-10 MED ORDER — MORPHINE SULFATE 2 MG/ML IJ SOLN
1.0000 mg | INTRAMUSCULAR | Status: DC | PRN
  Administered 2014-02-12: 1 mg via INTRAVENOUS
  Administered 2014-02-12: 2 mg via INTRAVENOUS
  Filled 2014-02-10 (×2): qty 1

## 2014-02-10 MED ORDER — FENTANYL CITRATE 0.05 MG/ML IJ SOLN
INTRAMUSCULAR | Status: AC
  Filled 2014-02-10: qty 5

## 2014-02-10 MED ORDER — ONDANSETRON HCL 4 MG PO TABS: 4.0000 mg | ORAL_TABLET | Freq: Four times a day (QID) | ORAL | Status: DC | PRN

## 2014-02-10 MED ORDER — PROMETHAZINE HCL 25 MG/ML IJ SOLN: 6.2500 mg | INTRAMUSCULAR | Status: DC | PRN

## 2014-02-10 MED ORDER — BUPIVACAINE ON-Q PAIN PUMP (FOR ORDER SET NO CHG)
INJECTION | Status: AC
  Filled 2014-02-10: qty 1

## 2014-02-10 MED ORDER — DEXTROSE 5 % IV SOLN
1.0000 g | Freq: Four times a day (QID) | INTRAVENOUS | Status: AC
  Administered 2014-02-10 – 2014-02-11 (×3): 1 g via INTRAVENOUS
  Filled 2014-02-10 (×3): qty 1

## 2014-02-10 MED ORDER — ROCURONIUM BROMIDE 100 MG/10ML IV SOLN
INTRAVENOUS | Status: AC
  Filled 2014-02-10: qty 1

## 2014-02-10 MED ORDER — NALOXONE HCL 0.4 MG/ML IJ SOLN: 0.4000 mg | INTRAMUSCULAR | Status: DC | PRN

## 2014-02-10 MED ORDER — BUPIVACAINE-EPINEPHRINE 0.25% -1:200000 IJ SOLN
INTRAMUSCULAR | Status: AC
  Filled 2014-02-10: qty 1

## 2014-02-10 MED ORDER — NEOSTIGMINE METHYLSULFATE 10 MG/10ML IV SOLN
INTRAVENOUS | Status: DC | PRN
  Administered 2014-02-10: 4 mg via INTRAVENOUS

## 2014-02-10 MED ORDER — SODIUM CHLORIDE 0.9 % IJ SOLN: 9.0000 mL | INTRAMUSCULAR | Status: DC | PRN

## 2014-02-10 MED ORDER — LACTATED RINGERS IV SOLN
INTRAVENOUS | Status: DC | PRN
  Administered 2014-02-10 (×2): via INTRAVENOUS

## 2014-02-10 MED ORDER — LIDOCAINE HCL 1 % IJ SOLN
INTRAMUSCULAR | Status: AC
  Filled 2014-02-10: qty 20

## 2014-02-10 MED ORDER — KCL IN DEXTROSE-NACL 20-5-0.45 MEQ/L-%-% IV SOLN
INTRAVENOUS | Status: AC
  Filled 2014-02-10: qty 1000

## 2014-02-10 MED ORDER — LACTATED RINGERS IV SOLN: INTRAVENOUS | Status: DC

## 2014-02-10 MED ORDER — ONDANSETRON HCL 4 MG/2ML IJ SOLN
INTRAMUSCULAR | Status: DC | PRN
  Administered 2014-02-10: 4 mg via INTRAVENOUS

## 2014-02-10 MED ORDER — ACETAMINOPHEN 10 MG/ML IV SOLN
1000.0000 mg | Freq: Once | INTRAVENOUS | Status: AC
  Administered 2014-02-10: 1000 mg via INTRAVENOUS
  Filled 2014-02-10: qty 100

## 2014-02-10 MED ORDER — MORPHINE SULFATE (PF) 1 MG/ML IV SOLN
INTRAVENOUS | Status: AC
  Filled 2014-02-10: qty 25

## 2014-02-10 MED ORDER — ENOXAPARIN SODIUM 40 MG/0.4ML ~~LOC~~ SOLN
40.0000 mg | SUBCUTANEOUS | Status: DC
  Administered 2014-02-11 – 2014-02-14 (×4): 40 mg via SUBCUTANEOUS
  Filled 2014-02-10 (×5): qty 0.4

## 2014-02-10 MED ORDER — DIPHENHYDRAMINE HCL 50 MG/ML IJ SOLN: 12.5000 mg | Freq: Four times a day (QID) | INTRAMUSCULAR | Status: DC | PRN

## 2014-02-10 MED ORDER — DEXAMETHASONE SODIUM PHOSPHATE 10 MG/ML IJ SOLN
INTRAMUSCULAR | Status: AC
  Filled 2014-02-10: qty 1

## 2014-02-10 MED ORDER — FENTANYL CITRATE 0.05 MG/ML IJ SOLN
INTRAMUSCULAR | Status: DC | PRN
  Administered 2014-02-10 (×8): 50 ug via INTRAVENOUS

## 2014-02-10 MED ORDER — CARBOXYMETHYLCELLULOSE SODIUM 1 % OP SOLN: 1.0000 [drp] | Freq: Two times a day (BID) | OPHTHALMIC | Status: DC

## 2014-02-10 MED ORDER — KCL IN DEXTROSE-NACL 20-5-0.45 MEQ/L-%-% IV SOLN
INTRAVENOUS | Status: DC
  Administered 2014-02-11 – 2014-02-14 (×6): via INTRAVENOUS
  Filled 2014-02-10 (×10): qty 1000

## 2014-02-10 MED ORDER — MIDAZOLAM HCL 2 MG/2ML IJ SOLN
INTRAMUSCULAR | Status: AC
  Filled 2014-02-10: qty 2

## 2014-02-10 MED ORDER — ONDANSETRON HCL 4 MG/2ML IJ SOLN
INTRAMUSCULAR | Status: AC
  Filled 2014-02-10: qty 2

## 2014-02-10 MED ORDER — DIPHENHYDRAMINE HCL 12.5 MG/5ML PO ELIX: 12.5000 mg | ORAL_SOLUTION | Freq: Four times a day (QID) | ORAL | Status: DC | PRN

## 2014-02-10 MED ORDER — HYDROMORPHONE HCL PF 1 MG/ML IJ SOLN
0.2500 mg | INTRAMUSCULAR | Status: DC | PRN
  Administered 2014-02-10 (×4): 0.5 mg via INTRAVENOUS

## 2014-02-10 MED ORDER — MORPHINE SULFATE (PF) 1 MG/ML IV SOLN
INTRAVENOUS | Status: DC
  Administered 2014-02-10: 13:00:00 via INTRAVENOUS
  Administered 2014-02-10: 1.5 mg via INTRAVENOUS
  Administered 2014-02-10: 3 mg via INTRAVENOUS
  Administered 2014-02-11: 1.5 mg via INTRAVENOUS
  Administered 2014-02-11: 3 mg via INTRAVENOUS

## 2014-02-10 MED ORDER — NEOSTIGMINE METHYLSULFATE 10 MG/10ML IV SOLN
INTRAVENOUS | Status: AC
  Filled 2014-02-10: qty 1

## 2014-02-10 MED ORDER — PROPOFOL 10 MG/ML IV BOLUS
INTRAVENOUS | Status: AC
Start: 2014-02-10 — End: 2014-02-10
  Filled 2014-02-10: qty 20

## 2014-02-10 MED ORDER — PROPOFOL 10 MG/ML IV BOLUS
INTRAVENOUS | Status: DC | PRN
  Administered 2014-02-10: 130 mg via INTRAVENOUS

## 2014-02-10 SURGICAL SUPPLY — 93 items
APPLIER CLIP 5 13 M/L LIGAMAX5 (MISCELLANEOUS)
APPLIER CLIP ROT 10 11.4 M/L (STAPLE)
APR CLP MED LRG 11.4X10 (STAPLE)
APR CLP MED LRG 5 ANG JAW (MISCELLANEOUS)
BLADE EXTENDED COATED 6.5IN (ELECTRODE) ×2 IMPLANT
BLADE HEX COATED 2.75 (ELECTRODE) ×4 IMPLANT
BLADE SURG SZ10 CARB STEEL (BLADE) ×2 IMPLANT
CABLE HIGH FREQUENCY MONO STRZ (ELECTRODE) ×2 IMPLANT
CANISTER SUCTION 2500CC (MISCELLANEOUS) ×2 IMPLANT
CATH KIT ON Q 7.5IN SLV (PAIN MANAGEMENT) IMPLANT
CELLS DAT CNTRL 66122 CELL SVR (MISCELLANEOUS) ×1 IMPLANT
CLIP APPLIE 5 13 M/L LIGAMAX5 (MISCELLANEOUS) IMPLANT
CLIP APPLIE ROT 10 11.4 M/L (STAPLE) IMPLANT
COUNTER NEEDLE 20 DBL MAG RED (NEEDLE) ×2 IMPLANT
COVER MAYO STAND STRL (DRAPES) ×4 IMPLANT
DECANTER SPIKE VIAL GLASS SM (MISCELLANEOUS) ×2 IMPLANT
DRAIN CHANNEL 19F RND (DRAIN) ×1 IMPLANT
DRAPE LAPAROSCOPIC ABDOMINAL (DRAPES) ×2 IMPLANT
DRAPE LG THREE QUARTER DISP (DRAPES) ×2 IMPLANT
DRAPE UTILITY XL STRL (DRAPES) ×4 IMPLANT
DRAPE WARM FLUID 44X44 (DRAPE) ×2 IMPLANT
DRSG OPSITE POSTOP 4X10 (GAUZE/BANDAGES/DRESSINGS) IMPLANT
DRSG OPSITE POSTOP 4X6 (GAUZE/BANDAGES/DRESSINGS) IMPLANT
DRSG OPSITE POSTOP 4X8 (GAUZE/BANDAGES/DRESSINGS) IMPLANT
DRSG TEGADERM 4X4.75 (GAUZE/BANDAGES/DRESSINGS) IMPLANT
DRSG TELFA 4X14 ISLAND ADH (GAUZE/BANDAGES/DRESSINGS) ×1 IMPLANT
ELECT REM PT RETURN 9FT ADLT (ELECTROSURGICAL) ×2
ELECTRODE REM PT RTRN 9FT ADLT (ELECTROSURGICAL) ×1 IMPLANT
ENDOLOOP SUT PDS II  0 18 (SUTURE)
ENDOLOOP SUT PDS II 0 18 (SUTURE) IMPLANT
EVACUATOR 1/8 PVC DRAIN (DRAIN) ×1 IMPLANT
EVACUATOR SILICONE 100CC (DRAIN) ×1 IMPLANT
GAUZE SPONGE 4X4 12PLY STRL (GAUZE/BANDAGES/DRESSINGS) ×2 IMPLANT
GLOVE BIO SURGEON STRL SZ 6 (GLOVE) ×6 IMPLANT
GLOVE BIOGEL PI IND STRL 6.5 (GLOVE) IMPLANT
GLOVE BIOGEL PI IND STRL 7.5 (GLOVE) IMPLANT
GLOVE BIOGEL PI INDICATOR 6.5 (GLOVE) ×2
GLOVE BIOGEL PI INDICATOR 7.5 (GLOVE) ×2
GLOVE INDICATOR 6.5 STRL GRN (GLOVE) ×4 IMPLANT
GOWN SPEC L4 XLG W/TWL (GOWN DISPOSABLE) ×6 IMPLANT
GOWN STRL REUS W/ TWL XL LVL3 (GOWN DISPOSABLE) ×3 IMPLANT
GOWN STRL REUS W/TWL XL LVL3 (GOWN DISPOSABLE) ×10
KIT BASIN OR (CUSTOM PROCEDURE TRAY) ×2 IMPLANT
LEGGING LITHOTOMY PAIR STRL (DRAPES) ×1 IMPLANT
LUBRICANT JELLY K Y 4OZ (MISCELLANEOUS) IMPLANT
PENCIL BUTTON HOLSTER BLD 10FT (ELECTRODE) ×3 IMPLANT
RETRACTOR WND ALEXIS 18 MED (MISCELLANEOUS) IMPLANT
RTRCTR WOUND ALEXIS 18CM MED (MISCELLANEOUS) ×2
SCISSORS LAP 5X35 DISP (ENDOMECHANICALS) ×2 IMPLANT
SEALER TISSUE G2 CVD JAW 35 (ENDOMECHANICALS) IMPLANT
SEALER TISSUE G2 CVD JAW 45CM (ENDOMECHANICALS)
SEALER TISSUE G2 STRG ARTC 35C (ENDOMECHANICALS) IMPLANT
SET IRRIG TUBING LAPAROSCOPIC (IRRIGATION / IRRIGATOR) ×2 IMPLANT
SHEARS HARMONIC 9CM CVD (BLADE) ×1 IMPLANT
SHEARS HARMONIC ACE PLUS 36CM (ENDOMECHANICALS) IMPLANT
SLEEVE SURGEON STRL (DRAPES) IMPLANT
SLEEVE XCEL OPT CAN 5 100 (ENDOMECHANICALS) ×4 IMPLANT
SOLUTION ANTI FOG 6CC (MISCELLANEOUS) ×2 IMPLANT
SPONGE LAP 18X18 X RAY DECT (DISPOSABLE) ×4 IMPLANT
STAPLER PROXIMATE 75MM BLUE (STAPLE) ×1 IMPLANT
STAPLER VISISTAT 35W (STAPLE) ×2 IMPLANT
SUCTION POOLE TIP (SUCTIONS) ×2 IMPLANT
SUT MNCRL AB 4-0 PS2 18 (SUTURE) ×2 IMPLANT
SUT PDS AB 1 CTX 36 (SUTURE) IMPLANT
SUT PDS AB 1 TP1 96 (SUTURE) IMPLANT
SUT PDS AB 3-0 SH 27 (SUTURE) ×2 IMPLANT
SUT PROLENE 2 0 KS (SUTURE) IMPLANT
SUT SILK 2 0 (SUTURE) ×2
SUT SILK 2 0 SH CR/8 (SUTURE) ×1 IMPLANT
SUT SILK 2-0 18XBRD TIE 12 (SUTURE) IMPLANT
SUT SILK 3 0 (SUTURE) ×2
SUT SILK 3 0 SH CR/8 (SUTURE) ×2 IMPLANT
SUT SILK 3-0 18XBRD TIE 12 (SUTURE) IMPLANT
SUT VIC AB 2-0 SH 18 (SUTURE) ×2 IMPLANT
SUT VIC AB 3-0 SH 18 (SUTURE) ×2 IMPLANT
SUT VICRYL 2 0 18  UND BR (SUTURE) ×1
SUT VICRYL 2 0 18 UND BR (SUTURE) ×1 IMPLANT
SUT VICRYL 3 0 BR 18  UND (SUTURE) ×1
SUT VICRYL 3 0 BR 18 UND (SUTURE) ×1 IMPLANT
SYR BULB IRRIGATION 50ML (SYRINGE) ×2 IMPLANT
SYS LAPSCP GELPORT 120MM (MISCELLANEOUS)
SYSTEM LAPSCP GELPORT 120MM (MISCELLANEOUS) IMPLANT
TOWEL OR 17X26 10 PK STRL BLUE (TOWEL DISPOSABLE) ×4 IMPLANT
TOWEL OR NON WOVEN STRL DISP B (DISPOSABLE) ×4 IMPLANT
TRAY FOLEY CATH 14FRSI W/METER (CATHETERS) ×2 IMPLANT
TRAY LAP CHOLE (CUSTOM PROCEDURE TRAY) ×2 IMPLANT
TROCAR BLADELESS OPT 5 100 (ENDOMECHANICALS) ×2 IMPLANT
TROCAR XCEL BLUNT TIP 100MML (ENDOMECHANICALS) IMPLANT
TROCAR XCEL NON-BLD 11X100MML (ENDOMECHANICALS) IMPLANT
TUBING CONNECTING 10 (TUBING) IMPLANT
TUBING FILTER THERMOFLATOR (ELECTROSURGICAL) ×2 IMPLANT
TUNNELER SHEATH ON-Q 16GX12 DP (PAIN MANAGEMENT) ×1 IMPLANT
YANKAUER SUCT BULB TIP 10FT TU (MISCELLANEOUS) ×4 IMPLANT

## 2014-02-10 NOTE — Anesthesia Postprocedure Evaluation (Signed)
Anesthesia Post Note  Patient: Stephanie [name]  Procedure(s) Performed: Procedure(s) (LRB): DIAGNOSTIC LAPAROSCOPIC converted open PARTIAL DUODENAL RESECTION (N/A)  Anesthesia type: General  Patient location: PACU  Post pain: Pain level controlled  Post assessment: Post-op Vital signs reviewed  Last Vitals:  Filed Vitals:   02/10/14 1345  BP: 119/58  Pulse: 66  Temp:   Resp: 12    Post vital signs: Reviewed  Level of consciousness: sedated  Complications: No apparent anesthesia complications

## 2014-02-10 NOTE — Discharge Instructions (Addendum)
[facility]      [facility] °[phone] ° °ABDOMINAL SURGERY: POST OP INSTRUCTIONS ° °Always review your discharge instruction sheet given to you by the facility where your surgery was performed. ° °IF YOU HAVE DISABILITY OR FAMILY LEAVE FORMS, YOU MUST BRING THEM TO THE OFFICE FOR PROCESSING.  PLEASE DO NOT GIVE THEM TO YOUR DOCTOR. ° °1. A prescription for pain medication may be given to you upon discharge.  Take your pain medication as prescribed, if needed.  If narcotic pain medicine is not needed, then you may take acetaminophen (Tylenol) or ibuprofen (Advil) as needed. °2. Take your usually prescribed medications unless otherwise directed. °3. If you need a refill on your pain medication, please contact your pharmacy. They will contact our office to request authorization.  Prescriptions will not be filled after 5pm or on week-ends. °4. You should follow a light diet the first few days after arrival home, such as soup and crackers, pudding, etc.unless your doctor has advised otherwise. A high-fiber, low fat diet can be resumed as tolerated.   Be sure to include lots of fluids daily. Most patients will experience some swelling and bruising on the chest and neck area.  Ice packs will help.  Swelling and bruising can take several days to resolve °5. Most patients will experience some swelling and bruising in the area of the incision. Ice pack will help. Swelling and bruising can take several days to resolve..  °6. It is common to experience some constipation if taking pain medication after surgery.  Increasing fluid intake and taking a stool softener will usually help or prevent this problem from occurring.  A mild laxative (Milk of Magnesia or Miralax) should be taken according to package directions if there are no bowel movements after 48 hours. °7.  You may have steri-strips (small skin tapes) in place directly over the incision.  These strips should be left on the skin for 10-14 days.  If your  surgeon used skin glue on the incision, you may shower in 48 hours.  The glue will flake off over the next 2-3 weeks.  Any sutures or staples will be removed at the office during your follow-up visit. You may find that a light gauze bandage over your incision may keep your staples from being rubbed or pulled. You may shower and replace the bandage daily. °8. ACTIVITIES:  You may resume regular (light) daily activities beginning the next day--such as daily self-care, walking, climbing stairs--gradually increasing activities as tolerated.  You may have sexual intercourse when it is comfortable.  Refrain from any heavy lifting or straining until approved by your doctor. °a. You may drive when you no longer are taking prescription pain medication, you can comfortably wear a seatbelt, and you can safely maneuver your car and apply brakes °b. Return to Work: __________8 weeks if applicable_________________________ °9. You should see your doctor in the office for a follow-up appointment approximately two weeks after your surgery.  Make sure that you call for this appointment within a day or two after you arrive home to insure a convenient appointment time. °OTHER INSTRUCTIONS:  °_____________________________________________________________ °_____________________________________________________________ ° °WHEN TO CALL YOUR DOCTOR: °1. Fever over 101.0 °2. Inability to urinate °3. Nausea and/or vomiting °4. Extreme swelling or bruising °5. Continued bleeding from incision. °6. Increased pain, redness, or drainage from the incision. °7. Difficulty swallowing or breathing °8. Muscle cramping or spasms. °9. Numbness or tingling in hands or feet or around lips. ° °The clinic staff is   available to answer your questions during regular business hours.  Please dont hesitate to call and ask to speak to one of the nurses if you have concerns.  For further questions, please visit [email or web address]   Bulb Drain Home  Care A bulb drain consists of a thin rubber tube and a soft, round bulb that creates a gentle suction. The rubber tube is placed in the area where you had surgery. A bulb is attached to the end of the tube that is outside the body. The bulb drain removes excess fluid that normally builds up in a surgical wound after surgery. The color and amount of fluid will vary. Immediately after surgery, the fluid is bright red and is a little thicker than water. It may gradually change to a yellow or pink color and become more thin and water-like. When the amount decreases to about 1 or 2 tbsp in 24 hours, your health care provider will usually remove it. DAILY CARE  Keep the bulb flat (compressed) at all times, except while emptying it. The flatness creates suction. You can flatten the bulb by squeezing it firmly in the middle and then closing the cap.  Keep sites where the tube enters the skin dry and covered with a bandage (dressing).  Secure the tube 1-2 in (2.5-5.1 cm) below the insertion sites, to keep it from pulling on your stitches. The tube is stitched in place and will not slip out.  Secure the bulb as directed by your health care provider.  For the first 3 days after surgery, there usually is more fluid in the bulb. Empty the bulb whenever it becomes half full because the bulb does not create enough suction if it is too full. The bulb could also overflow. Write down how much fluid you remove each time you empty your drain. Add up the amount removed in 24 hours.  Empty the bulb at the same time every day once the amount of fluid decreases and you only need to empty it once a day. Write down the amounts and the 24-hour totals to give to your health care provider. This helps your health care provider know when the tubes can be removed. EMPTYING THE BULB DRAIN Before emptying the bulb, get a measuring cup, a piece of paper, and a pen and wash your hands.  Gently run your fingers down the tube  (stripping) to empty any drainage from the tubing into the bulb. This may need to be done several times a day to clear the tubing of clots and tissue.  Open the bulb cap to release suction, which causes it to inflate. Do not touch the inside of the cap.  Gently run your fingers down the tube (stripping) to empty any drainage from the tubing into the bulb.  Hold the cap out of the way, and pour fluid into the measuring cup.   Squeeze the bulb to provide suction.  Replace the cap.   Check the tape that holds the tube to your skin. If it is becoming loose, you can remove the loose piece of tape and apply a new one. Then, pin the bulb to your shirt.   Write down the amount of fluid you emptied out. Write down the date and each time you emptied your bulb drain. (If there are 2 bulbs, note the amount of drainage from each bulb and keep the totals separate. Your health care provider will want to know the total amounts for each drain and which tube is draining  more.)   Flush the fluid down the toilet and wash your hands.   Call your health care provider once you have less than 2 tbsp of fluid collecting in the bulb drain every 24 hours. If there is drainage around the tube site, change dressings and keep the area dry. Cleanse around tube with sterile saline and place dry gauze around site. This gauze should be changed when it is soiled. If it stays clean and unsoiled, it should still be changed daily.  SEEK MEDICAL CARE IF:  Your drainage has a bad smell or is cloudy.   You have a fever.   Your drainage is increasing instead of decreasing.   Your tube fell out.   You have redness or swelling around the tube site.   You have drainage from a surgical wound.   Your bulb drain will not stay flat after you empty it.  MAKE SURE YOU:   Understand these instructions.  Will watch your condition.  Will get help right away if you are not doing well or get worse. Document Released:  07/28/2000 Document Revised: 05/21/2013 Document Reviewed: 01/03/2012 [employer] Patient Information 2015 [place], [place]. This information is not intended to replace advice given to you by your health care provider. Make sure you discuss any questions you have with your health care provider.

## 2014-02-10 NOTE — Op Note (Signed)
PRE-OPERATIVE DIAGNOSIS: duodenal mass  POST-OPERATIVE DIAGNOSIS:  Duodenal mass times 2, jejunal mass times 12, ileal mass x 4, Meckel's diverticulum  PROCEDURE:  Procedure(s): Diagnostic laparoscopy, open resection of duodenal mass times 2 with primary closure, resection of jejunal mass with primary closure, resection of Meckel's diverticulum  SURGEON:  Surgeon(s): Stark Klein, MD  ASSISTANT SURGEON: Greer Pickerel, MD  ANESTHESIA:   local and general  DRAINS: (52 Fr) Blake drain(s) in the RUQ, exiting the Left abdomen   LOCAL MEDICATIONS USED:  BUPIVICAINE  and LIDOCAINE   SPECIMEN:  Source of Specimen:  duodenal mass D2, second duodenal mass D2, jejunal mass, meckel's diverticulum  DISPOSITION OF SPECIMEN:  PATHOLOGY  COUNTS:  YES  DICTATION: .Dragon Dictation  PLAN OF CARE: Admit to inpatient   PATIENT DISPOSITION:  PACU - hemodynamically stable.  EBL:  100 mL  FINDINGS:  Large 4 cm mass as seen on endoscopy.  Additional duodenal mass seen.  Many jejunal masses scattered throughout the jejunum.  Most 1-3 mm.  The largest one was 8 mm, this was removed.  There were 4 in the ileum as well.  These are presumed GISTs.  PROCEDURE:    The patient was identified in the holding area and taken to the operating room where she was placed supine on the operating room table.  General anesthesia was induced. A Foley catheter was placed.  Arms were tucked. The patient's abdomen was prepped and draped in sterile fashion.   A timeout was performed according to the surgical safety checklist. When all was correct, we continued.  The supraumbilical skin was anesthetized with local anesthetic. A vertical incision was made approximately 1.5 cm in length with a #11 blade. The subcutaneous tissue was spread with a Kelly clamp. The fascia was elevated with 2 Kocher clamps and then incised in the midline with a #11 blade. A Claiborne Billings was used to confirm entrance into the peritoneal cavity. The 0 Vicryl  was used to create a pursestring suture around the fascial incision. The Sheryle Hail was advanced into the abdomen and held in place with the tails of the suture. Pneumoperitoneum was achieved to a pressure of 15 mm of mercury. The camera was placed into the abdomen the patient was placed in reverse Trendelenburg position and rotated to the left. A second trocar that was 5 mm was placed in the left abdomen. This was done after the administration of local. The omentum was pulled down off of the duodenum and the masses seem to be larger than expected. It also appeared to pucker the duodenum. Decision was made to open.   The [facility] trocar site was extended up the midline. The subcutaneous tissues are divided with the cautery. The fascia was entered in the midline with the cautery as well. The wound protector was placed on the abdominal wall. A Bookwalter was set up to assist with visualization. The small bowel was run. There were numerous small masses along the small bowel that are described in the operative findings. These were all scattered and not amenable to resection.  A. Meckel's diverticulum was also found. This was in the anticipated location approximately 18 inches from the ileocecal valve.  There was a small mass proximal to the large index mass. This was taken off of the duodenum with the harmonic scalpel. This only required a muscular and serosal excision. The mucosa was left intact. This was closed with 4 interrupted 2-0 silk sutures. Attention was then directed to the larger mass. This was resected with  the cautery and the harmonic scalpel. The incision was away from the ampulla.  Once the defect was created, the duodenum was closed transversely with running 3-0 PDS suture in [place] fashion and several interrupted 2-0 silk sutures were placed to imbricate the staple line.   Attention was then directed to the jejunum. The larger mass was removed with the cautery. This was then closed transversely as well  with interrupted 3-0 silk sutures. A 75 mm GIA stapler was used to take off the Meckel's diverticulum. The abdomen was then inspected for hemostasis. A 19 [place] Blake drain was placed in the right upper quadrant at the duodenal defect. The NG tube placement was confirmed. The On-Q tunneler sheaths were placed in the preperitoneal space on either side of the incision. The fascia was then closed using running #1 loop PDS sutures. The skin was irrigated and closed with staples. The On-Q catheters were advanced into the tunneler sheaths and the sheaths were removed. The wound was then cleaned, dried, and dressed with dry sterile dressings.  The patient was awakened from anesthesia and taken to PACU in stable condition. Needle, sponge, and instrument counts were correct x2.

## 2014-02-10 NOTE — H&P (View-Only) (Signed)
Chief Complaint  Patient presents with  . New Evaluation    GIST    HISTORY: Patient is a 58 year old female is seen in consultation at the request of Dr. Ardis Hughs for evaluation of a GI stromal tumor in her duodenum. She presented with left chest pain to her primary care physician in January of this year. She underwent a cardiac workup which was negative. She also had a stress test. Her primary care doctor heard a bruit in her abdomen and ordered an ultrasound.  This demonstrated an abdominal mass and she underwent a CT scan. She subsequently had on endoscopy record work showing a Schatzki's ring and a duodenal mass. Dr. Ardis Hughs performed an endoscopic ultrasound and noted that this was in the submucosal and muscular location of the duodenal wall. This was not obstructing. It was quite friable.  Biopsies were nondiagnostic. She denies any early satiety, nausea, or vomiting. She denies any hematemesis.   Past Medical History  Diagnosis Date  . Hypercholesteremia   . Headache(784.0)   . GERD (gastroesophageal reflux disease)   . Neurofibromatosis   . 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 Facial reconstruction surgery      X 5-6 times  . Skin grafts  age 86    bilateral legs  . Cesarean section      X 3  . Colonoscopy  05/08/2011    XFG:[id number] colon  . Esophagogastroduodenoscopy      hiatal hernia per RMR, 2001 or so.  . Eye surgery      neurofibroma removed behind eye  . Esophagogastroduodenoscopy N/A 12/15/2013    Procedure: ESOPHAGOGASTRODUODENOSCOPY (EGD);  Surgeon: Daneil Dolin, MD;  Location: [facility];  Service: Endoscopy;  Laterality: N/A;  1:15-moved to [address] to notify pt  . Neurofibromatosis tumor removed  as child  . Cyst removed from hand Left over 10 yrs ago  . Eus N/A 01/08/2014    Procedure: UPPER ENDOSCOPIC ULTRASOUND (EUS) LINEAR;  Surgeon: Milus Banister, MD;  Location: [facility];  Service: Endoscopy;   Laterality: N/A;    Current Outpatient Prescriptions  Medication Sig Dispense Refill  . acetaminophen (TYLENOL) 500 MG tablet Take 1,000 mg by mouth every 6 (six) hours as needed. For pain      . aspirin 81 MG tablet Take 81 mg by mouth every evening.       Marland Kitchen atorvastatin (LIPITOR) 80 MG tablet Take 40 mg by mouth every evening.       Marland Kitchen CALCIUM-VITAMIN D PO Take 1 tablet by mouth 2 (two) times daily.       Marland Kitchen ibuprofen (ADVIL,MOTRIN) 200 MG tablet Take 400 mg by mouth every 6 (six) hours as needed for moderate pain.      Marland Kitchen loratadine (CLARITIN) 10 MG tablet Take 10 mg by mouth daily.      . pantoprazole (PROTONIX) 40 MG tablet Take 40 mg by mouth every morning.       . Polyvinyl Alcohol-Povidone (REFRESH OP) Place 1 drop into the right eye daily as needed (dry eyes).       No current facility-administered medications for this visit.     No Known Allergies   Family History  Problem Relation Age of Onset  . Colon cancer Neg Hx      History   Social History  . Marital Status: Widowed    Spouse Name: N/A    Number of  Children: 2  . Years of Education: N/A   Occupational History  . works at [employer]  . Smoking status: Never Smoker   . Smokeless tobacco: Never Used  . Alcohol Use: Yes     Comment: occasionally  . Drug Use: No  . Sexual Activity: None   Other Topics Concern  . None   Social History Narrative  . None     REVIEW OF SYSTEMS - PERTINENT POSITIVES ONLY: 12 point review of systems negative other than HPI and PMH except for headaches and visual disturbances.   EXAM: Filed Vitals:   01/20/14 1042  BP: 114/72  Pulse: 70  Temp: 98 F (36.7 C)  Resp: 18    Wt Readings from Last 3 Encounters:  01/20/14 121 lb (54.885 kg)  01/08/14 123 lb (55.792 kg)  01/08/14 123 lb (55.792 kg)     Gen:  No acute distress.  Well nourished and well groomed.   Neurological: Alert and oriented to person, place, and  time. Coordination normal.  Head: Normocephalic and atraumatic.  Eyes: Conjunctivae are normal. Right pupil is round, and reactive to light. No scleral icterus. left eye is surgically absent with reconstruction.   Neck: Normal range of motion. Neck supple. No tracheal deviation or thyromegaly present.  Cardiovascular: Normal rate, regular rhythm, normal heart sounds and intact distal pulses.  Exam reveals no gallop and no friction rub.  No murmur heard. Respiratory: Effort normal.  No respiratory distress. No chest wall tenderness. Breath sounds normal.  No wheezes, rales or rhonchi.  GI: Soft. Bowel sounds are normal. The abdomen is soft and nontender.  There is no rebound and no guarding.  Musculoskeletal: Normal range of motion. Extremities are nontender.  Lymphadenopathy: No cervical, preauricular, postauricular or axillary adenopathy is present Skin: Skin is warm and dry. No rash noted. No diaphoresis. No erythema. No pallor. No clubbing, cyanosis, or edema.  Numerous neurofibromas present throughout. Psychiatric: Normal mood and affect. Behavior is normal. Judgment and thought content normal.    LABORATORY RESULTS: Available labs are reviewed   No results found for this or any previous visit (from the past 2160 hour(s)). surg path Diagnosis Small Intestine Biopsy, nodule/mass PEPTIC DUODENITIS. NO VILLOUS ATROPHY, HELICOBACTER PYLORI OR MALIGNANCY IDENTIFIED  Cytology for EUS Diagnosis FINE NEEDLE ASPIRATION: NEEDLE ASPIRATION, ENDOSCOPIC DUODENAL (SPECIMEN 1 OF 1 COLLECTED ON 01/08/14) SPINDLE CELL PROLIFERATION, SEE COMMENT.   RADIOLOGY RESULTS: See E-Chart or I-Site for most recent results.  Images and reports are reviewed.  Ct at [place].  hyperattenuating mass from descending duodenum.      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 This will need to be a full-thickness resection.  If this is on the anterior or lateral portion of the duodenum, we could do this robotically or laparoscopically. However, if it is posterior, we will need to do it open. I have requested that Dr. Ardis Hughs tattoo the stalk of the GI stromal tumor. In that way, when we looked at with the camera, we can assess immediately if we will need to open.    I reviewed the risk of surgery with the patient. I discussed the risk of bleeding, infection, damage to adjacent structures, hernia, weak, prolonged nausea and vomiting, potential need for additional procedures or surgeries, cardiac and pulmonary complications, or death.  The patient understands and wishes to proceed. We will do this on an elective  basis.     Milus Height MD Surgical Oncology, [facility]      Visit Diagnoses: 1. 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

## 2014-02-10 NOTE — Transfer of Care (Signed)
Immediate Anesthesia Transfer of Care Note  Patient: Stephanie [name]  Procedure(s) Performed: Procedure(s): DIAGNOSTIC LAPAROSCOPIC/PARTIAL DUODENAL RESECTION/POSSIBLE OPEN (N/A)  Patient Location: PACU  Anesthesia Type:General  Level of Consciousness: awake, alert , oriented and patient cooperative  Airway & Oxygen Therapy: Patient Spontanous Breathing and Patient connected to face mask oxygen  Post-op Assessment: Report given to PACU RN and Post -op Vital signs reviewed and stable  Post vital signs: Reviewed and stable  Complications: No apparent anesthesia complications

## 2014-02-10 NOTE — Anesthesia Preprocedure Evaluation (Signed)
Anesthesia Evaluation  Patient identified by MRN, date of birth, ID band Patient awake    Reviewed: Allergy & Precautions, H&P , NPO status , Patient's Chart, lab work & pertinent test results  Airway Mallampati: II TM Distance: >3 FB Neck ROM: Full    Dental no notable dental hx. (+) Partial Lower, Upper Dentures   Pulmonary neg pulmonary ROS,  breath sounds clear to auscultation  Pulmonary exam normal       Cardiovascular negative cardio ROS  Rhythm:Regular Rate:Normal     Neuro/Psych neurofibromatosis negative psych ROS   GI/Hepatic negative GI ROS, Neg liver ROS, GERD-  Medicated,  Endo/Other  negative endocrine ROS  Renal/GU negative Renal ROS  negative genitourinary   Musculoskeletal negative musculoskeletal ROS (+)   Abdominal   Peds negative pediatric ROS (+)  Hematology negative hematology ROS (+)   Anesthesia Other Findings   Reproductive/Obstetrics negative OB ROS                           Anesthesia Physical Anesthesia Plan  ASA: III  Anesthesia Plan: General   Post-op Pain Management:    Induction: Intravenous  Airway Management Planned: Oral ETT  Additional Equipment:   Intra-op Plan:   Post-operative Plan: Extubation in OR  Informed Consent: I have reviewed the patients History and Physical, chart, labs and discussed the procedure including the risks, benefits and alternatives for the proposed anesthesia with the patient or authorized representative who has indicated his/her understanding and acceptance.   Dental advisory given  Plan Discussed with: CRNA  Anesthesia Plan Comments:         Anesthesia Quick Evaluation

## 2014-02-10 NOTE — Interval H&P Note (Signed)
History and Physical Interval Note:  02/10/2014 10:04 AM  Stephanie [name]  has presented today for surgery, with the diagnosis of duodenal mass  The various methods of treatment have been discussed with the patient and family. After consideration of risks, benefits and other options for treatment, the patient has consented to  Procedure(s): DIAGNOSTIC LAPAROSCOPIC/PARTIAL DUODENAL RESECTION/POSSIBLE OPEN (N/A) as a surgical intervention .  The patient's history has been reviewed, patient examined, no change in status, stable for surgery.  I have reviewed the patient's chart and labs.  Questions were answered to the patient's satisfaction.     BYERLY,FAERA

## 2014-02-11 ENCOUNTER — Encounter (HOSPITAL_COMMUNITY): Payer: Self-pay | Admitting: General Surgery

## 2014-02-11 LAB — BASIC METABOLIC PANEL
BUN: 6 mg/dL (ref 6–23)
CALCIUM: 9 mg/dL (ref 8.4–10.5)
CO2: 26 mEq/L (ref 19–32)
Chloride: 99 mEq/L (ref 96–112)
Creatinine, Ser: 0.6 mg/dL (ref 0.50–1.10)
GLUCOSE: 130 mg/dL — AB (ref 70–99)
Potassium: 5.1 mEq/L (ref 3.7–5.3)
Sodium: 134 mEq/L — ABNORMAL LOW (ref 137–147)

## 2014-02-11 LAB — CBC
HEMATOCRIT: 39.4 % (ref 36.0–46.0)
Hemoglobin: 12.9 g/dL (ref 12.0–15.0)
MCH: 28.2 pg (ref 26.0–34.0)
MCHC: 32.7 g/dL (ref 30.0–36.0)
MCV: 86.2 fL (ref 78.0–100.0)
Platelets: 233 10*3/uL (ref 150–400)
RBC: 4.57 MIL/uL (ref 3.87–5.11)
RDW: 12.7 % (ref 11.5–15.5)
WBC: 16.4 10*3/uL — ABNORMAL HIGH (ref 4.0–10.5)

## 2014-02-11 LAB — PHOSPHORUS: Phosphorus: 3.1 mg/dL (ref 2.3–4.6)

## 2014-02-11 LAB — MAGNESIUM: Magnesium: 1.9 mg/dL (ref 1.5–2.5)

## 2014-02-11 NOTE — Progress Notes (Signed)
1 Day Post-Op  Subjective: Pain OK. No N/V.  No overnight events.    Objective: Vital signs in last 24 hours: Temp:  [97.4 F (36.3 C)-98.5 F (36.9 C)] 98.1 F (36.7 C) (07/01 0551) Pulse Rate:  [57-86] 79 (07/01 0551) Resp:  [10-19] 19 (07/01 0551) BP: (112-161)/(58-82) 140/67 mmHg (07/01 0551) SpO2:  [93 %-100 %] 100 % (07/01 0551) Last BM Date: 02/10/14  Intake/Output from previous day: 06/30 0701 - 07/01 0700 In: 3551.7 [I.V.:3351.7; IV Piggyback:200] Out: 1150 [Urine:1000; Drains:50; Blood:100] Intake/Output this shift:    General appearance: alert, cooperative and no distress Resp: breathing comfortably GI: soft, approp tender.  drain serosang  Lab Results:   Recent Labs  02/10/14 1814 02/11/14 0520  WBC 19.1* 16.4*  HGB 14.2 12.9  HCT 44.1 39.4  PLT 195 233   BMET  Recent Labs  02/10/14 1814 02/11/14 0520  NA  --  134*  K  --  5.1  CL  --  99  CO2  --  26  GLUCOSE  --  130*  BUN  --  6  CREATININE 0.55 0.60  CALCIUM  --  9.0   PT/INR No results found for this basename: LABPROT, INR,  in the last 72 hours ABG No results found for this basename: PHART, PCO2, PO2, HCO3,  in the last 72 hours  Studies/Results: No results found.  Anti-infectives: Anti-infectives   Start     Dose/Rate Route Frequency Ordered Stop   02/10/14 1700  cefOXitin (MEFOXIN) 1 g in dextrose 5 % 50 mL IVPB     1 g 100 mL/hr over 30 Minutes Intravenous Every 6 hours 02/10/14 1614 02/11/14 0535   02/10/14 0645  cefOXitin (MEFOXIN) 2 g in dextrose 5 % 50 mL IVPB     2 g 100 mL/hr over 30 Minutes Intravenous On call to O.R. 02/10/14 0645 02/10/14 1031      Assessment/Plan: s/p Procedure(s): DIAGNOSTIC LAPAROSCOPIC converted open PARTIAL DUODENAL RESECTION (N/A) Continue foley due to urinary output monitoring 24 h antibiotic prophylaxis. lovenox for VTE prophylaxis PCA Hope to d/c NGT and foley tomorrow.    LOS: 1 day    [facility] 02/11/2014

## 2014-02-12 LAB — CBC
HCT: 42.6 % (ref 36.0–46.0)
Hemoglobin: 13.7 g/dL (ref 12.0–15.0)
MCH: 27.8 pg (ref 26.0–34.0)
MCHC: 32.2 g/dL (ref 30.0–36.0)
MCV: 86.6 fL (ref 78.0–100.0)
Platelets: 238 10*3/uL (ref 150–400)
RBC: 4.92 MIL/uL (ref 3.87–5.11)
RDW: 12.7 % (ref 11.5–15.5)
WBC: 13.4 10*3/uL — ABNORMAL HIGH (ref 4.0–10.5)

## 2014-02-12 LAB — BASIC METABOLIC PANEL
ANION GAP: 11 (ref 5–15)
BUN: 5 mg/dL — ABNORMAL LOW (ref 6–23)
CHLORIDE: 100 meq/L (ref 96–112)
CO2: 27 meq/L (ref 19–32)
Calcium: 9.4 mg/dL (ref 8.4–10.5)
Creatinine, Ser: 0.52 mg/dL (ref 0.50–1.10)
GFR calc Af Amer: >90 mL/min (ref >90)
GFR calc non Af Amer: >90 mL/min (ref >90)
Glucose, Bld: 134 mg/dL — ABNORMAL HIGH (ref 70–99)
Potassium: 4.5 mEq/L (ref 3.7–5.3)
SODIUM: 138 meq/L (ref 137–147)

## 2014-02-12 MED ORDER — LORATADINE 10 MG PO TABS
10.0000 mg | ORAL_TABLET | Freq: Every day | ORAL | Status: DC
  Administered 2014-02-12 – 2014-02-15 (×4): 10 mg via ORAL
  Filled 2014-02-12 (×4): qty 1

## 2014-02-12 MED ORDER — DIPHENHYDRAMINE HCL 50 MG/ML IJ SOLN
12.5000 mg | Freq: Four times a day (QID) | INTRAMUSCULAR | Status: DC | PRN
  Administered 2014-02-14: 12.5 mg via INTRAVENOUS
  Filled 2014-02-12: qty 1

## 2014-02-12 MED ORDER — ATORVASTATIN CALCIUM 40 MG PO TABS
40.0000 mg | ORAL_TABLET | Freq: Every evening | ORAL | Status: DC
  Administered 2014-02-12 – 2014-02-14 (×3): 40 mg via ORAL
  Filled 2014-02-12 (×4): qty 1

## 2014-02-12 NOTE — Progress Notes (Signed)
Care of pt assumed. No change in initial AM assessment at this time. Continue with plan of care.

## 2014-02-12 NOTE — Progress Notes (Signed)
Patient ID: SHERALYN PINEGAR, female   DOB: 03/27/1956, 58 y.o.   MRN: [id number] 2 Days Post-Op  Subjective: Did well yesterday.  Was out of bed.  Complaining about oxygen.    Objective: Vital signs in last 24 hours: Temp:  [98 F (36.7 C)-99 F (37.2 C)] 98.1 F (36.7 C) (07/02 0541) Pulse Rate:  [83-96] 92 (07/02 0541) Resp:  [17-22] 20 (07/02 0541) BP: (126-144)/(64-77) 126/64 mmHg (07/02 0541) SpO2:  [92 %-100 %] 96 % (07/02 0541) Last BM Date: 02/10/14  Intake/Output from previous day: 07/01 0701 - 07/02 0700 In: 2520 [I.V.:2500; NG/GT:20] Out: 4225 [Urine:3900; Emesis/NG output:200; Drains:125] Intake/Output this shift:    General appearance: alert, cooperative and no distress Resp: breathing comfortably GI: soft, approp tender.  drain serosang  Lab Results:   Recent Labs  02/11/14 0520 02/12/14 0500  WBC 16.4* 13.4*  HGB 12.9 13.7  HCT 39.4 42.6  PLT 233 238   BMET  Recent Labs  02/11/14 0520 02/12/14 0500  NA 134* 138  K 5.1 4.5  CL 99 100  CO2 26 27  GLUCOSE 130* 134*  BUN 6 5*  CREATININE 0.60 0.52  CALCIUM 9.0 9.4   PT/INR No results found for this basename: LABPROT, INR,  in the last 72 hours ABG No results found for this basename: PHART, PCO2, PO2, HCO3,  in the last 72 hours  Studies/Results: No results found.  Anti-infectives: Anti-infectives   Start     Dose/Rate Route Frequency Ordered Stop   02/10/14 1700  cefOXitin (MEFOXIN) 1 g in dextrose 5 % 50 mL IVPB     1 g 100 mL/hr over 30 Minutes Intravenous Every 6 hours 02/10/14 1614 02/11/14 0535   02/10/14 0645  cefOXitin (MEFOXIN) 2 g in dextrose 5 % 50 mL IVPB     2 g 100 mL/hr over 30 Minutes Intravenous On call to O.R. 02/10/14 0645 02/10/14 1031      Assessment/Plan: s/p Procedure(s): DIAGNOSTIC LAPAROSCOPIC converted open PARTIAL DUODENAL RESECTION (N/A) D/c foley D/c PCA, PRN morphine Restart lipitor and claritin. Sips of clears and ice chips.   D/c NGT   LOS: 2  days    [facility] 02/12/2014

## 2014-02-13 LAB — BASIC METABOLIC PANEL
Anion gap: 10 (ref 5–15)
BUN: 9 mg/dL (ref 6–23)
CHLORIDE: 101 meq/L (ref 96–112)
CO2: 27 mEq/L (ref 19–32)
Calcium: 9.5 mg/dL (ref 8.4–10.5)
Creatinine, Ser: 0.49 mg/dL — ABNORMAL LOW (ref 0.50–1.10)
GFR calc non Af Amer: >90 mL/min (ref >90)
GLUCOSE: 111 mg/dL — AB (ref 70–99)
POTASSIUM: 4.6 meq/L (ref 3.7–5.3)
Sodium: 138 mEq/L (ref 137–147)

## 2014-02-13 LAB — CBC
HEMATOCRIT: 42 % (ref 36.0–46.0)
HEMOGLOBIN: 13.6 g/dL (ref 12.0–15.0)
MCH: 28.2 pg (ref 26.0–34.0)
MCHC: 32.4 g/dL (ref 30.0–36.0)
MCV: 87 fL (ref 78.0–100.0)
Platelets: 200 10*3/uL (ref 150–400)
RBC: 4.83 MIL/uL (ref 3.87–5.11)
RDW: 12.8 % (ref 11.5–15.5)
WBC: 12.1 10*3/uL — AB (ref 4.0–10.5)

## 2014-02-13 MED ORDER — ACETAMINOPHEN 325 MG PO TABS
650.0000 mg | ORAL_TABLET | Freq: Four times a day (QID) | ORAL | Status: DC | PRN
  Administered 2014-02-13 – 2014-02-15 (×5): 650 mg via ORAL
  Filled 2014-02-13 (×5): qty 2

## 2014-02-13 NOTE — Progress Notes (Signed)
3 Days Post-Op  Subjective: Pt without complaints  Objective: Vital signs in last 24 hours: Temp:  [98 F (36.7 C)-98.6 F (37 C)] 98 F (36.7 C) (07/03 0612) Pulse Rate:  [95-97] 95 (07/03 0612) Resp:  [16-18] 16 (07/03 0612) BP: (131-133)/(71-74) 133/74 mmHg (07/03 0612) SpO2:  [97 %] 97 % (07/03 0612) Weight:  [117 lb 8.1 oz (53.3 kg)] 117 lb 8.1 oz (53.3 kg) (07/03 0612) Last BM Date:  (pre-op)  Intake/Output from previous day: 07/02 0701 - 07/03 0700 In: 720 [P.O.:120; I.V.:600] Out: 1022 [Urine:1000; Drains:22] Intake/Output this shift:    Incision/Wound:dressing with minimal drainage  Pumps in place Non distended  Lab Results:   Recent Labs  02/12/14 0500 02/13/14 0435  WBC 13.4* 12.1*  HGB 13.7 13.6  HCT 42.6 42.0  PLT 238 200   BMET  Recent Labs  02/12/14 0500 02/13/14 0435  NA 138 138  K 4.5 4.6  CL 100 101  CO2 27 27  GLUCOSE 134* 111*  BUN 5* 9  CREATININE 0.52 0.49*  CALCIUM 9.4 9.5   PT/INR No results found for this basename: LABPROT, INR,  in the last 72 hours ABG No results found for this basename: PHART, PCO2, PO2, HCO3,  in the last 72 hours  Studies/Results: No results found.  Anti-infectives: Anti-infectives   Start     Dose/Rate Route Frequency Ordered Stop   02/10/14 1700  cefOXitin (MEFOXIN) 1 g in dextrose 5 % 50 mL IVPB     1 g 100 mL/hr over 30 Minutes Intravenous Every 6 hours 02/10/14 1614 02/11/14 0535   02/10/14 0645  cefOXitin (MEFOXIN) 2 g in dextrose 5 % 50 mL IVPB     2 g 100 mL/hr over 30 Minutes Intravenous On call to O.R. 02/10/14 0645 02/10/14 1031      Assessment/Plan: s/p Procedure(s): DIAGNOSTIC LAPAROSCOPIC converted open PARTIAL DUODENAL RESECTION (N/A) Doing very well Try clears Remove on Q in am OOB Change dressing in am  LOS: 3 days    Leonidus Rowand A. 02/13/2014

## 2014-02-14 LAB — BASIC METABOLIC PANEL
Anion gap: 13 (ref 5–15)
BUN: 10 mg/dL (ref 6–23)
CALCIUM: 9.4 mg/dL (ref 8.4–10.5)
CO2: 25 mEq/L (ref 19–32)
Chloride: 99 mEq/L (ref 96–112)
Creatinine, Ser: 0.51 mg/dL (ref 0.50–1.10)
GFR calc Af Amer: >90 mL/min (ref >90)
GFR calc non Af Amer: >90 mL/min (ref >90)
Glucose, Bld: 118 mg/dL — ABNORMAL HIGH (ref 70–99)
Potassium: 4.1 mEq/L (ref 3.7–5.3)
Sodium: 137 mEq/L (ref 137–147)

## 2014-02-14 LAB — CBC
HCT: 42.4 % (ref 36.0–46.0)
HEMOGLOBIN: 13.8 g/dL (ref 12.0–15.0)
MCH: 28 pg (ref 26.0–34.0)
MCHC: 32.5 g/dL (ref 30.0–36.0)
MCV: 86.2 fL (ref 78.0–100.0)
Platelets: 222 10*3/uL (ref 150–400)
RBC: 4.92 MIL/uL (ref 3.87–5.11)
RDW: 12.6 % (ref 11.5–15.5)
WBC: 11.6 10*3/uL — ABNORMAL HIGH (ref 4.0–10.5)

## 2014-02-14 MED ORDER — OXYCODONE-ACETAMINOPHEN 5-325 MG PO TABS
1.0000 | ORAL_TABLET | ORAL | Status: DC | PRN
Start: 2014-02-14 — End: 2014-02-15
  Administered 2014-02-14 – 2014-02-15 (×3): 1 via ORAL
  Filled 2014-02-14 (×3): qty 1

## 2014-02-14 NOTE — Progress Notes (Signed)
The abdominal dressing was changed using wet to dry 4x4 gauze,covered with ABD pad and taped with Hypafix tape. Patient tolerated this very well.

## 2014-02-14 NOTE — Progress Notes (Signed)
4 Days Post-Op  Subjective: DOING WELL.  Objective: Vital signs in last 24 hours: Temp:  [98 F (36.7 C)-98.4 F (36.9 C)] 98.4 F (36.9 C) (07/04 0513) Pulse Rate:  [87-96] 95 (07/04 0513) Resp:  [18] 18 (07/04 0513) BP: (131-146)/(76-77) 135/76 mmHg (07/04 0513) SpO2:  [97 %] 97 % (07/04 0513) Last BM Date:  (pre-op)  Intake/Output from previous day: 07/03 0701 - 07/04 0700 In: 2370 [P.O.:1200; I.V.:1170] Out: 1175 [Urine:1150; Drains:25] Intake/Output this shift:    Incision/Wound:CLEAN SOFT MIN TENDER  Lab Results:   Recent Labs  02/13/14 0435 02/14/14 0517  WBC 12.1* 11.6*  HGB 13.6 13.8  HCT 42.0 42.4  PLT 200 222   BMET  Recent Labs  02/13/14 0435 02/14/14 0517  NA 138 137  K 4.6 4.1  CL 101 99  CO2 27 25  GLUCOSE 111* 118*  BUN 9 10  CREATININE 0.49* 0.51  CALCIUM 9.5 9.4   PT/INR No results found for this basename: LABPROT, INR,  in the last 72 hours ABG No results found for this basename: PHART, PCO2, PO2, HCO3,  in the last 72 hours  Studies/Results: No results found.  Anti-infectives: Anti-infectives   Start     Dose/Rate Route Frequency Ordered Stop   02/10/14 1700  cefOXitin (MEFOXIN) 1 g in dextrose 5 % 50 mL IVPB     1 g 100 mL/hr over 30 Minutes Intravenous Every 6 hours 02/10/14 1614 02/11/14 0535   02/10/14 0645  cefOXitin (MEFOXIN) 2 g in dextrose 5 % 50 mL IVPB     2 g 100 mL/hr over 30 Minutes Intravenous On call to O.R. 02/10/14 0645 02/10/14 1031      Assessment/Plan: s/p Procedure(s): DIAGNOSTIC LAPAROSCOPIC converted open PARTIAL DUODENAL RESECTION (N/A) Path reviewed with pt Doing well Adv diet Home Sunday Keep JP for now D/c on Q  LOS: 4 days    Stephanie [name] A. 02/14/2014

## 2014-02-15 LAB — BASIC METABOLIC PANEL
Anion gap: 11 (ref 5–15)
BUN: 11 mg/dL (ref 6–23)
CHLORIDE: 100 meq/L (ref 96–112)
CO2: 28 meq/L (ref 19–32)
CREATININE: 0.62 mg/dL (ref 0.50–1.10)
Calcium: 9.3 mg/dL (ref 8.4–10.5)
GFR calc Af Amer: >90 mL/min (ref >90)
GFR calc non Af Amer: >90 mL/min (ref >90)
GLUCOSE: 99 mg/dL (ref 70–99)
Potassium: 4.4 mEq/L (ref 3.7–5.3)
Sodium: 139 mEq/L (ref 137–147)

## 2014-02-15 LAB — CBC
HCT: 39.2 % (ref 36.0–46.0)
Hemoglobin: 13.2 g/dL (ref 12.0–15.0)
MCH: 28.9 pg (ref 26.0–34.0)
MCHC: 33.7 g/dL (ref 30.0–36.0)
MCV: 86 fL (ref 78.0–100.0)
Platelets: 205 10*3/uL (ref 150–400)
RBC: 4.56 MIL/uL (ref 3.87–5.11)
RDW: 12.6 % (ref 11.5–15.5)
WBC: 10 10*3/uL (ref 4.0–10.5)

## 2014-02-15 MED ORDER — OXYCODONE-ACETAMINOPHEN 5-325 MG PO TABS: 1.0000 | ORAL_TABLET | ORAL | Status: DC | PRN

## 2014-02-15 NOTE — Progress Notes (Signed)
5 Days Post-Op  Subjective: TOLERATING DIET GOOD PAIN CONTROL   Objective: Vital signs in last 24 hours: Temp:  [97.8 F (36.6 C)-98.3 F (36.8 C)] 98.1 F (36.7 C) (07/05 0535) Pulse Rate:  [87-103] 92 (07/05 0535) Resp:  [20] 20 (07/05 0535) BP: (120-133)/(72-76) 133/75 mmHg (07/05 0535) SpO2:  [96 %-98 %] 97 % (07/05 0535) Last BM Date: 02/10/14  Intake/Output from previous day: 07/04 0701 - 07/05 0700 In: 1640 [P.O.:1440; I.V.:200] Out: 1540 [Urine:1500; Drains:40] Intake/Output this shift:    Incision/Wound:CDI  JP SEROUS  Lab Results:   Recent Labs  02/14/14 0517 02/15/14 0450  WBC 11.6* 10.0  HGB 13.8 13.2  HCT 42.4 39.2  PLT 222 205   BMET  Recent Labs  02/14/14 0517 02/15/14 0450  NA 137 139  K 4.1 4.4  CL 99 100  CO2 25 28  GLUCOSE 118* 99  BUN 10 11  CREATININE 0.51 0.62  CALCIUM 9.4 9.3   PT/INR No results found for this basename: LABPROT, INR,  in the last 72 hours ABG No results found for this basename: PHART, PCO2, PO2, HCO3,  in the last 72 hours  Studies/Results: No results found.  Anti-infectives: Anti-infectives   Start     Dose/Rate Route Frequency Ordered Stop   02/10/14 1700  cefOXitin (MEFOXIN) 1 g in dextrose 5 % 50 mL IVPB     1 g 100 mL/hr over 30 Minutes Intravenous Every 6 hours 02/10/14 1614 02/11/14 0535   02/10/14 0645  cefOXitin (MEFOXIN) 2 g in dextrose 5 % 50 mL IVPB     2 g 100 mL/hr over 30 Minutes Intravenous On call to O.R. 02/10/14 0645 02/10/14 1031      Assessment/Plan: s/p Procedure(s): DIAGNOSTIC LAPAROSCOPIC converted open PARTIAL DUODENAL RESECTION (N/A) Discharge KEEP JP FOLLOW UP THIS WEEK FOR STAPLE REMOVAL AND JP  LOS: 5 days    Eber Ferrufino A. 02/15/2014

## 2014-02-18 NOTE — Discharge Summary (Signed)
Physician Discharge Summary  Patient ID: Stephanie [name] MRN: [id number] DOB/AGE: 12/18/1955 58 y.o.  Admit date: 02/10/2014 Discharge date: 02/18/2014  Admission Diagnoses: Patient Active Problem List   Diagnosis Date Noted  . 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 GIST (gastrointestinal stromal tumor) of small bowel, malignant 02/10/2014  . Abnormal CT scan, small bowel 12/02/2013    Discharge Diagnoses:  Active Problems:   GIST (gastrointestinal stromal tumor) of small bowel, malignant   Discharged Condition: stable  Hospital Course:  Pt was admitted to the floor after a duodenal resection and small bowel resection, meckel's diverticulectomy.  She had her NGT and foley in for 48 hours.  She had some nausea but this resolved.  She was able to ambulated independently.  She was able to void without the foley.  She was discharged to home in stable condition.  Her diet was able to be advanced.  She was able to have good pain control with oral narcotics.    Consults: None  Significant Diagnostic Studies: labs: see epic.    Treatments: surgery: see above.    Discharge Exam: Blood pressure 133/75, pulse 92, temperature 98.1 F (36.7 C), temperature source Oral, resp. rate 20, height 4\' 11"  (1.499 m), weight 117 lb 8.1 oz (53.3 kg), SpO2 97.00%. General appearance: alert, cooperative and no distress Resp: breathing comfortably GI: soft, approp tender, non distended.  Disposition: 01-Home or Self Care  Discharge Instructions   Diet - low sodium heart healthy    Complete by:  As directed      Increase activity slowly    Complete by:  As directed             Medication List         acetaminophen 500 MG tablet  Commonly known as:  TYLENOL  Take 1,000 mg by mouth every 6 (six) hours as needed. For pain     aspirin EC 81 MG tablet  Take 81 mg by mouth every morning.     atorvastatin 80 MG tablet  Commonly  known as:  LIPITOR  Take 40 mg by mouth every evening.     CALCIUM-VITAMIN D PO  Take 1 tablet by mouth 2 (two) times daily.     ibuprofen 200 MG tablet  Commonly known as:  ADVIL,MOTRIN  Take 400 mg by mouth every 6 (six) hours as needed for moderate pain.     loratadine 10 MG tablet  Commonly known as:  CLARITIN  Take 10 mg by mouth daily.     oxyCODONE-acetaminophen 5-325 MG per tablet  Commonly known as:  PERCOCET/ROXICET  Take 1 tablet by mouth every 4 (four) hours as needed for moderate pain.     pantoprazole 40 MG tablet  Commonly known as:  PROTONIX  Take 40 mg by mouth every morning.     REFRESH OP  Place 1 drop into both eyes as needed (dry eyes).           Follow-up Information   Follow up with [facility], [place] In 2 weeks.   Specialty:  General Surgery   Contact information:   [address] [place] [address] [phone]       Signed: Stark Klein 02/18/2014, 10:56 AM

## 2014-02-18 NOTE — Addendum Note (Signed)
Addended by: Lowry Ram on: 02/18/2014 12:46 PM   Modules accepted: Level of Service

## 2014-02-20 ENCOUNTER — Ambulatory Visit (INDEPENDENT_AMBULATORY_CARE_PROVIDER_SITE_OTHER): Payer: BC Managed Care – PPO

## 2014-02-20 NOTE — Progress Notes (Unsigned)
Patient in for staple removal/drain removal ; Clarified order with DR. Cornett ; staple removal and JP to be removed, drainage 10-15cc per day x3 days . Patient afebrile , no redness, odor noted  . Cleansed area with chorea prep , removed drain, removed 15 abdomen staples. Apply strei steri glue and steri streps to abd. Incision . Patient tolerated well. Advised patient to call if temp 100.3, redness, odor noted Patient verbalized understanding

## 2014-03-03 ENCOUNTER — Encounter (INDEPENDENT_AMBULATORY_CARE_PROVIDER_SITE_OTHER): Payer: Self-pay

## 2014-03-03 ENCOUNTER — Encounter (INDEPENDENT_AMBULATORY_CARE_PROVIDER_SITE_OTHER): Payer: Self-pay | Admitting: General Surgery

## 2014-03-03 ENCOUNTER — Ambulatory Visit (INDEPENDENT_AMBULATORY_CARE_PROVIDER_SITE_OTHER): Payer: BC Managed Care – PPO | Admitting: General Surgery

## 2014-03-03 VITALS — BP 122/76 | HR 91 | Temp 97.6°F | Ht 59.0 in | Wt 118.0 lb

## 2014-03-03 DIAGNOSIS — C179 Malignant neoplasm of small intestine, unspecified: Secondary | ICD-10-CM

## 2014-03-03 DIAGNOSIS — C49A3 Gastrointestinal stromal tumor of small intestine: Secondary | ICD-10-CM

## 2014-03-03 NOTE — Patient Instructions (Signed)
Follow up in around 4-5 weeks.  Follow up with Dr. Marin Olp.

## 2014-03-05 ENCOUNTER — Other Ambulatory Visit (INDEPENDENT_AMBULATORY_CARE_PROVIDER_SITE_OTHER): Payer: Self-pay | Admitting: General Surgery

## 2014-03-05 DIAGNOSIS — C49A3 Gastrointestinal stromal tumor of small intestine: Secondary | ICD-10-CM

## 2014-03-05 NOTE — Progress Notes (Signed)
HISTORY: Pt is s/p exploratory laparotomy with resection of duodenal GIST, and several of many small bowel GISTs.  She is still feeling a bit weak.  She also does not have a big appetite.      EXAM: General:  Alert and oriented.   Incision:  Healing well.     PATHOLOGY: Diagnosis 1. Small intestine, resection for tumor GASTROINTESTINAL STROMAL TUMOR, 5.3 CM. PLEASE SEE ONCOLOGY TEMPLATE FOR DETAILS. 2. Duodenum, Biopsy GASTROINTESTINAL STROMAL TUMOR, 1.3 CM. PLEASE SEE ONCOLOGY TEMPLATE FOR DETAILS. 3. Jejunum, biopsy GASTROINTESTINAL STROMAL TUMOR, 1.6 CM. PLEASE SEE ONCOLOGY TEMPLATE FOR DETAILS. 4. Diverticulum, Meckle's FINDINGS CONSISTENT WITH MECKLE'S DIVERTICULUM. NO EVIDENCE OF GASTROINTESTINAL STROMAL TUMOR. NO EVIDENCE OF DYSPLASIA OR MALIGNANCY.   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    Follow up with me in 4-5 weeks.  Refer to Dr. Marin Olp for possible [place] therapy.        Milus Height, MD Surgical Oncology, [facility]  Glo Herring., MD No ref. provider found

## 2014-03-05 NOTE — Assessment & Plan Note (Addendum)
Work on increasing intake.    Follow up with me in 4-5 weeks.  Refer to Dr. Marin Olp for possible [place] therapy.

## 2014-03-06 ENCOUNTER — Ambulatory Visit: Payer: BC Managed Care – PPO

## 2014-03-06 ENCOUNTER — Ambulatory Visit (HOSPITAL_BASED_OUTPATIENT_CLINIC_OR_DEPARTMENT_OTHER): Payer: BC Managed Care – PPO | Admitting: Hematology & Oncology

## 2014-03-06 ENCOUNTER — Other Ambulatory Visit (HOSPITAL_BASED_OUTPATIENT_CLINIC_OR_DEPARTMENT_OTHER): Payer: BC Managed Care – PPO | Admitting: Lab

## 2014-03-06 ENCOUNTER — Encounter: Payer: Self-pay | Admitting: Hematology & Oncology

## 2014-03-06 VITALS — BP 116/65 | HR 78 | Temp 97.9°F | Resp 14 | Ht <= 58 in | Wt 119.0 lb

## 2014-03-06 DIAGNOSIS — D481 Neoplasm of uncertain behavior of connective and other soft tissue: Secondary | ICD-10-CM

## 2014-03-06 DIAGNOSIS — Q8500 Neurofibromatosis, unspecified: Secondary | ICD-10-CM

## 2014-03-06 DIAGNOSIS — C49A3 Gastrointestinal stromal tumor of small intestine: Secondary | ICD-10-CM

## 2014-03-06 DIAGNOSIS — D4819 Other specified neoplasm of uncertain behavior of connective and other soft tissue: Secondary | ICD-10-CM

## 2014-03-06 LAB — COMPREHENSIVE METABOLIC PANEL
ALBUMIN: 4.1 g/dL (ref 3.5–5.2)
ALT: 11 U/L (ref 0–35)
AST: 9 U/L (ref 0–37)
Alkaline Phosphatase: 95 U/L (ref 39–117)
BUN: 8 mg/dL (ref 6–23)
CALCIUM: 9.2 mg/dL (ref 8.4–10.5)
CHLORIDE: 106 meq/L (ref 96–112)
CO2: 31 meq/L (ref 19–32)
Creatinine, Ser: 0.6 mg/dL (ref 0.50–1.10)
Glucose, Bld: 83 mg/dL (ref 70–99)
Potassium: 4.8 mEq/L (ref 3.5–5.3)
Sodium: 141 mEq/L (ref 135–145)
Total Bilirubin: 0.3 mg/dL (ref 0.2–1.2)
Total Protein: 6.6 g/dL (ref 6.0–8.3)

## 2014-03-06 LAB — CBC WITH DIFFERENTIAL (CANCER CENTER ONLY)
BASO#: 0 10*3/uL (ref 0.0–0.2)
BASO%: 0.5 % (ref 0.0–2.0)
EOS ABS: 0.2 10*3/uL (ref 0.0–0.5)
EOS%: 2.7 % (ref 0.0–7.0)
HEMATOCRIT: 39.9 % (ref 34.8–46.6)
HEMOGLOBIN: 12.9 g/dL (ref 11.6–15.9)
LYMPH#: 1.6 10*3/uL (ref 0.9–3.3)
LYMPH%: 20.7 % (ref 14.0–48.0)
MCH: 28.8 pg (ref 26.0–34.0)
MCHC: 32.3 g/dL (ref 32.0–36.0)
MCV: 89 fL (ref 81–101)
MONO#: 0.6 10*3/uL (ref 0.1–0.9)
MONO%: 7.8 % (ref 0.0–13.0)
NEUT#: 5.3 10*3/uL (ref 1.5–6.5)
NEUT%: 68.3 % (ref 39.6–80.0)
Platelets: 212 10*3/uL (ref 145–400)
RBC: 4.48 10*6/uL (ref 3.70–5.32)
RDW: 13 % (ref 11.1–15.7)
WBC: 7.8 10*3/uL (ref 3.9–10.0)

## 2014-03-06 LAB — CHCC SATELLITE - SMEAR

## 2014-03-06 MED ORDER — IMATINIB MESYLATE 400 MG PO TABS
400.0000 mg | ORAL_TABLET | Freq: Every day | ORAL | Status: DC
Start: 2014-03-06 — End: 2014-03-27
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
## 2014-03-18 ENCOUNTER — Telehealth: Payer: Self-pay | Admitting: Hematology & Oncology

## 2014-03-18 NOTE — Telephone Encounter (Addendum)
[employer] approved GLEEVEC tab  Stephanie [name]: [id number] Status: Approved Dates: 03/11/2014 - 03/11/2015     COPY SCANNED

## 2014-03-24 ENCOUNTER — Other Ambulatory Visit: Payer: Self-pay | Admitting: *Deleted

## 2014-03-27 ENCOUNTER — Other Ambulatory Visit: Payer: Self-pay

## 2014-03-27 DIAGNOSIS — C49A3 Gastrointestinal stromal tumor of small intestine: Secondary | ICD-10-CM

## 2014-03-27 MED ORDER — IMATINIB MESYLATE 400 MG PO TABS: 400.0000 mg | ORAL_TABLET | Freq: Every day | ORAL | Status: DC

## 2014-04-10 ENCOUNTER — Encounter: Payer: Self-pay | Admitting: Family

## 2014-04-10 ENCOUNTER — Other Ambulatory Visit (HOSPITAL_BASED_OUTPATIENT_CLINIC_OR_DEPARTMENT_OTHER): Payer: BC Managed Care – PPO | Admitting: Lab

## 2014-04-10 ENCOUNTER — Ambulatory Visit (INDEPENDENT_AMBULATORY_CARE_PROVIDER_SITE_OTHER): Payer: BC Managed Care – PPO | Admitting: General Surgery

## 2014-04-10 ENCOUNTER — Ambulatory Visit (HOSPITAL_BASED_OUTPATIENT_CLINIC_OR_DEPARTMENT_OTHER): Payer: BC Managed Care – PPO | Admitting: Family

## 2014-04-10 VITALS — BP 118/63 | HR 94 | Temp 98.0°F | Resp 14 | Ht 59.0 in | Wt 121.0 lb

## 2014-04-10 VITALS — BP 140/64 | HR 87 | Temp 97.9°F | Ht 59.0 in | Wt 120.4 lb

## 2014-04-10 DIAGNOSIS — C179 Malignant neoplasm of small intestine, unspecified: Secondary | ICD-10-CM

## 2014-04-10 DIAGNOSIS — D481 Neoplasm of uncertain behavior of connective and other soft tissue: Secondary | ICD-10-CM

## 2014-04-10 DIAGNOSIS — C49A3 Gastrointestinal stromal tumor of small intestine: Secondary | ICD-10-CM

## 2014-04-10 LAB — CBC WITH DIFFERENTIAL (CANCER CENTER ONLY)
BASO#: 0 10*3/uL (ref 0.0–0.2)
BASO%: 0.5 % (ref 0.0–2.0)
EOS%: 2.2 % (ref 0.0–7.0)
Eosinophils Absolute: 0.2 10*3/uL (ref 0.0–0.5)
HEMATOCRIT: 38.8 % (ref 34.8–46.6)
HEMOGLOBIN: 12.7 g/dL (ref 11.6–15.9)
LYMPH#: 1.6 10*3/uL (ref 0.9–3.3)
LYMPH%: 19.2 % (ref 14.0–48.0)
MCH: 28.7 pg (ref 26.0–34.0)
MCHC: 32.7 g/dL (ref 32.0–36.0)
MCV: 88 fL (ref 81–101)
MONO#: 0.5 10*3/uL (ref 0.1–0.9)
MONO%: 5.8 % (ref 0.0–13.0)
NEUT#: 6 10*3/uL (ref 1.5–6.5)
NEUT%: 72.3 % (ref 39.6–80.0)
Platelets: 221 10*3/uL (ref 145–400)
RBC: 4.43 10*6/uL (ref 3.70–5.32)
RDW: 13.1 % (ref 11.1–15.7)
WBC: 8.3 10*3/uL (ref 3.9–10.0)

## 2014-04-10 LAB — COMPREHENSIVE METABOLIC PANEL
ALT: 14 U/L (ref 0–35)
AST: 13 U/L (ref 0–37)
Albumin: 4.2 g/dL (ref 3.5–5.2)
Alkaline Phosphatase: 102 U/L (ref 39–117)
BUN: 12 mg/dL (ref 6–23)
CALCIUM: 9 mg/dL (ref 8.4–10.5)
CHLORIDE: 105 meq/L (ref 96–112)
CO2: 29 mEq/L (ref 19–32)
Creatinine, Ser: 0.67 mg/dL (ref 0.50–1.10)
Glucose, Bld: 80 mg/dL (ref 70–99)
Potassium: 4 mEq/L (ref 3.5–5.3)
Sodium: 141 mEq/L (ref 135–145)
TOTAL PROTEIN: 6.5 g/dL (ref 6.0–8.3)
Total Bilirubin: 0.3 mg/dL (ref 0.2–1.2)

## 2014-04-10 LAB — PHOSPHORUS: PHOSPHORUS: 3 mg/dL (ref 2.3–4.6)

## 2014-04-10 NOTE — Assessment & Plan Note (Signed)
No evidence of surgical complications.  Repeat EGD at 6 months post op (december 2015)  Follow up with me in 6 months. Continue gleevec.   Return to work.  Light duty until 12 weeks post op.

## 2014-04-10 NOTE — Patient Instructions (Signed)
OK to go back to work Monday.  Follow up in 6 months.  Repeat EGD in December with Dr. Ardis Hughs.
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
## 2014-04-10 NOTE — Progress Notes (Signed)
HISTORY: Pt is s/p exploratory laparotomy 02/10/2014 with resection of duodenal GIST, and several of many small bowel GISTs.  Her energy level is improved.  She is doing well.  Her appetite is back to normal.  She denies nausea/vomiting.      EXAM: General:  Alert and oriented.   Incision:  Healed.  Small pucker at top of incision.     PATHOLOGY: Diagnosis 1. Small intestine, resection for tumor GASTROINTESTINAL STROMAL TUMOR, 5.3 CM. PLEASE SEE ONCOLOGY TEMPLATE FOR DETAILS. 2. Duodenum, Biopsy GASTROINTESTINAL STROMAL TUMOR, 1.3 CM. PLEASE SEE ONCOLOGY TEMPLATE FOR DETAILS. 3. Jejunum, biopsy GASTROINTESTINAL STROMAL TUMOR, 1.6 CM. PLEASE SEE ONCOLOGY TEMPLATE FOR DETAILS. 4. Diverticulum, Meckle's FINDINGS CONSISTENT WITH MECKLE'S DIVERTICULUM. NO EVIDENCE OF GASTROINTESTINAL STROMAL TUMOR. NO EVIDENCE OF DYSPLASIA OR MALIGNANCY.   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  Repeat EGD at 6 months post op (december 2015)  Follow up with me in 6 months. Continue gleevec.   Return to work.  Light duty until 12 weeks post op.         Milus Height, MD Surgical Oncology, [facility]  Glo Herring., MD [employer], MD

## 2014-04-28 ENCOUNTER — Ambulatory Visit (HOSPITAL_COMMUNITY)
Admission: RE | Admit: 2014-04-28 | Discharge: 2014-04-28 | Disposition: A | Discharge status: Home / Self Care | Payer: BC Managed Care – PPO | Source: Ambulatory Visit | Attending: Family Medicine | Admitting: Family Medicine

## 2014-04-28 ENCOUNTER — Other Ambulatory Visit (HOSPITAL_COMMUNITY): Payer: Self-pay | Admitting: Family Medicine

## 2014-04-28 DIAGNOSIS — M25519 Pain in unspecified shoulder: Secondary | ICD-10-CM | POA: Diagnosis not present

## 2014-04-28 DIAGNOSIS — M25511 Pain in right shoulder: Secondary | ICD-10-CM

## 2014-04-28 DIAGNOSIS — W19XXXA Unspecified fall, initial encounter: Secondary | ICD-10-CM | POA: Insufficient documentation

## 2014-05-04 ENCOUNTER — Other Ambulatory Visit: Payer: Self-pay | Admitting: *Deleted

## 2014-05-04 MED ORDER — METOCLOPRAMIDE HCL 5 MG PO TABS: 5.0000 mg | ORAL_TABLET | Freq: Four times a day (QID) | ORAL | Status: DC | PRN

## 2014-05-14 ENCOUNTER — Ambulatory Visit (HOSPITAL_BASED_OUTPATIENT_CLINIC_OR_DEPARTMENT_OTHER): Payer: BC Managed Care – PPO | Admitting: Hematology & Oncology

## 2014-05-14 ENCOUNTER — Other Ambulatory Visit (HOSPITAL_BASED_OUTPATIENT_CLINIC_OR_DEPARTMENT_OTHER): Payer: BC Managed Care – PPO | Admitting: Lab

## 2014-05-14 ENCOUNTER — Encounter: Payer: Self-pay | Admitting: Hematology & Oncology

## 2014-05-14 VITALS — BP 116/57 | HR 83 | Temp 97.3°F | Resp 14 | Ht 59.0 in | Wt 123.0 lb

## 2014-05-14 DIAGNOSIS — Q8500 Neurofibromatosis, unspecified: Secondary | ICD-10-CM

## 2014-05-14 DIAGNOSIS — C49A3 Gastrointestinal stromal tumor of small intestine: Secondary | ICD-10-CM

## 2014-05-14 DIAGNOSIS — C179 Malignant neoplasm of small intestine, unspecified: Secondary | ICD-10-CM

## 2014-05-14 LAB — CBC WITH DIFFERENTIAL (CANCER CENTER ONLY)
BASO#: 0 10*3/uL (ref 0.0–0.2)
BASO%: 0.2 % (ref 0.0–2.0)
EOS%: 2.4 % (ref 0.0–7.0)
Eosinophils Absolute: 0.3 10*3/uL (ref 0.0–0.5)
HCT: 38.5 % (ref 34.8–46.6)
HGB: 12.3 g/dL (ref 11.6–15.9)
LYMPH#: 2 10*3/uL (ref 0.9–3.3)
LYMPH%: 16.8 % (ref 14.0–48.0)
MCH: 29.4 pg (ref 26.0–34.0)
MCHC: 31.9 g/dL — AB (ref 32.0–36.0)
MCV: 92 fL (ref 81–101)
MONO#: 0.9 10*3/uL (ref 0.1–0.9)
MONO%: 7.7 % (ref 0.0–13.0)
NEUT%: 72.9 % (ref 39.6–80.0)
NEUTROS ABS: 8.7 10*3/uL — AB (ref 1.5–6.5)
PLATELETS: 253 10*3/uL (ref 145–400)
RBC: 4.19 10*6/uL (ref 3.70–5.32)
RDW: 15.7 % (ref 11.1–15.7)
WBC: 12 10*3/uL — ABNORMAL HIGH (ref 3.9–10.0)

## 2014-05-14 LAB — COMPREHENSIVE METABOLIC PANEL
ALBUMIN: 4.2 g/dL (ref 3.5–5.2)
ALK PHOS: 86 U/L (ref 39–117)
ALT: 12 U/L (ref 0–35)
AST: 11 U/L (ref 0–37)
BUN: 11 mg/dL (ref 6–23)
CALCIUM: 9.1 mg/dL (ref 8.4–10.5)
CHLORIDE: 110 meq/L (ref 96–112)
CO2: 25 mEq/L (ref 19–32)
Creatinine, Ser: 0.77 mg/dL (ref 0.50–1.10)
Glucose, Bld: 94 mg/dL (ref 70–99)
POTASSIUM: 4.5 meq/L (ref 3.5–5.3)
Sodium: 144 mEq/L (ref 135–145)
Total Bilirubin: 0.4 mg/dL (ref 0.2–1.2)
Total Protein: 6.1 g/dL (ref 6.0–8.3)

## 2014-05-14 LAB — PHOSPHORUS: Phosphorus: 4.3 mg/dL (ref 2.3–4.6)

## 2014-05-15 NOTE — Progress Notes (Signed)
Hematology and Oncology Follow Up Visit  Stephanie [name] [id number] 07-May-1956 58 y.o. 05/15/2014   Principle Diagnosis:  1.  Gastrointestinal stromal tumors  2. Neurofibromatosis  Current Therapy:   Gleevec 400 mg po q daily     Interim History:  [name] is back for followup. We last saw her back in August.  She's doing pre-well. Still working at assisted living. She is in joint is quite a bit.  She has had no problems with the [place]. She's had no diarrhea. There's been no leg swelling. She's had no rashes. There's been no cough or shortness of breath. She's had no nausea or vomiting.  She does have neurofibromatosis. This has not been a problem for her.  She's had no bleeding or bruising. She's had no mouth sores. There's been no headache. She has had her left eye nucleated. Her right eye has been doing okay.  Medications: Current outpatient prescriptions:acetaminophen (TYLENOL) 500 MG tablet, Take 1,000 mg by mouth every 6 (six) hours as needed. For pain, Disp: , Rfl: ;  atorvastatin (LIPITOR) 80 MG tablet, Take 40 mg by mouth every evening. , Disp: , Rfl: ;  CALCIUM-VITAMIN D PO, Take 1 tablet by mouth 2 (two) times daily. , Disp: , Rfl: ;  ibuprofen (ADVIL,MOTRIN) 200 MG tablet, Take 400 mg by mouth every 6 (six) hours as needed for moderate pain., Disp: , Rfl:  imatinib (GLEEVEC) 400 MG tablet, Take 1 tablet (400 mg total) by mouth daily. Take with meals and large glass of water.Caution:Chemotherapy., Disp: 30 tablet, Rfl: 12;  loratadine (CLARITIN) 10 MG tablet, Take 10 mg by mouth daily., Disp: , Rfl: ;  meloxicam (MOBIC) 15 MG tablet, Take 15 mg by mouth as needed for pain., Disp: , Rfl:  metoCLOPramide (REGLAN) 5 MG tablet, Take 1 tablet (5 mg total) by mouth every 6 (six) hours as needed for nausea or vomiting., Disp: 30 tablet, Rfl: 0;  oxyCODONE-acetaminophen (PERCOCET/ROXICET) 5-325 MG per tablet, Take 1 tablet by mouth every 4 (four) hours as needed for moderate  pain., Disp: 30 tablet, Rfl: 0;  pantoprazole (PROTONIX) 40 MG tablet, Take 40 mg by mouth every morning. , Disp: , Rfl:  Polyvinyl Alcohol-Povidone (REFRESH OP), Place 1 drop into both eyes as needed (dry eyes). , Disp: , Rfl:   Allergies: No Known Allergies  Past Medical History, Surgical history, Social history, and Family History were reviewed and updated.  Review of Systems: As above  Physical Exam:  height is 4\' 11"  (1.499 m) and weight is 123 lb (55.792 kg). Her oral temperature is 97.3 F (36.3 C). Her blood pressure is 116/57 and her pulse is 83. Her respiration is 14.   [name] in no obvious distress. Head and neck exam shows no ocular or oral lesions. Again, she has her left eye enucleated. Right eye has good extraocular muscle movement. There is no adenopathy in the neck. Lungs are clear. Cardiac exam regular in rhythm. Abdomen soft. She has good bowel sounds. There is no fluid wave. There is no palpable liver or spleen tip. She is well-healed laparotomy scar. Back exam shows no tenderness over the spine, ribs or hips. Extremities shows no clubbing, cyanosis or edema. His good range most of her joints. She has good strength in her extremities. Skin exam shows the multiple neurofibromas. No rashes are noted. Neurological exam is nonfocal.  Lab Results  Component Value Date   WBC 12.0* 05/14/2014   HGB 12.3 05/14/2014   HCT  38.5 05/14/2014   MCV 92 05/14/2014   PLT 253 05/14/2014     Chemistry      Component Value Date/Time   NA 144 05/14/2014 1412   K 4.5 05/14/2014 1412   CL 110 05/14/2014 1412   CO2 25 05/14/2014 1412   BUN 11 05/14/2014 1412   CREATININE 0.77 05/14/2014 1412      Component Value Date/Time   CALCIUM 9.1 05/14/2014 1412   ALKPHOS 86 05/14/2014 1412   AST 11 05/14/2014 1412   ALT 12 05/14/2014 1412   BILITOT 0.4 05/14/2014 1412         Impression and Plan: Stephanie [name] is 58 year old female with a just. This is metastatic. She did have resection  of a tumor that was symptomatic  She is on Gleevec.  We need to set her up with a scan to see how well she is doing. She will have a upper endoscopy in December.  I want to try get her scan done the same day that I see her. This will help her out so she does not have to miss a day of work.  From my point of view, I would have to think that would make life easier for her.  Volanda Napoleon, MD 10/2/20157:28 AM

## 2014-06-11 ENCOUNTER — Encounter: Payer: Self-pay | Admitting: Internal Medicine

## 2014-06-11 ENCOUNTER — Telehealth: Payer: Self-pay | Admitting: *Deleted

## 2014-06-11 NOTE — Telephone Encounter (Signed)
Pt called complaining of "cramps" in calves.  Pt was concerned that [place] was causing cramping. Informed pt that Dr Marin Olp is not concerned of the calf cramping and suggested for the pt to drink plenty of fluids. Pt verbalized understanding and stated she would call our office back if further issues arise.

## 2014-06-12 ENCOUNTER — Telehealth (INDEPENDENT_AMBULATORY_CARE_PROVIDER_SITE_OTHER): Payer: Self-pay

## 2014-06-12 NOTE — Telephone Encounter (Signed)
Pt calling to confirm if she should see Dr. Ardis Hughs for f/u EGD.  When she contacted their office she was told she would need to f/u with her GI MD, Dr. Gala Romney, in [place]. She has made an appt to see him.  Does she need to see Dr. Ardis Hughs specifically or can she keep the appt with Dr. Gala Romney?

## 2014-06-15 ENCOUNTER — Other Ambulatory Visit (HOSPITAL_COMMUNITY): Payer: Self-pay | Admitting: Family Medicine

## 2014-06-15 DIAGNOSIS — Z1231 Encounter for screening mammogram for malignant neoplasm of breast: Secondary | ICD-10-CM

## 2014-06-15 NOTE — Telephone Encounter (Signed)
Ok to see rourk in [place].

## 2014-06-15 NOTE — Telephone Encounter (Signed)
Pt made aware ok to follow up with Dr. Gala Romney.

## 2014-07-14 ENCOUNTER — Ambulatory Visit: Payer: BC Managed Care – PPO | Admitting: Gastroenterology

## 2014-07-15 ENCOUNTER — Encounter: Payer: Self-pay | Admitting: Hematology & Oncology

## 2014-07-15 ENCOUNTER — Other Ambulatory Visit (HOSPITAL_BASED_OUTPATIENT_CLINIC_OR_DEPARTMENT_OTHER): Payer: BC Managed Care – PPO | Admitting: Lab

## 2014-07-15 ENCOUNTER — Ambulatory Visit (HOSPITAL_BASED_OUTPATIENT_CLINIC_OR_DEPARTMENT_OTHER)
Admission: RE | Admit: 2014-07-15 | Discharge: 2014-07-15 | Disposition: A | Discharge status: Home / Self Care | Payer: BC Managed Care – PPO | Source: Ambulatory Visit | Attending: Hematology & Oncology | Admitting: Hematology & Oncology

## 2014-07-15 ENCOUNTER — Encounter (HOSPITAL_BASED_OUTPATIENT_CLINIC_OR_DEPARTMENT_OTHER): Payer: Self-pay

## 2014-07-15 ENCOUNTER — Ambulatory Visit (HOSPITAL_BASED_OUTPATIENT_CLINIC_OR_DEPARTMENT_OTHER): Payer: BC Managed Care – PPO | Admitting: Hematology & Oncology

## 2014-07-15 VITALS — BP 119/63 | HR 84 | Temp 97.7°F | Resp 14 | Ht 59.0 in | Wt 122.0 lb

## 2014-07-15 DIAGNOSIS — Z9221 Personal history of antineoplastic chemotherapy: Secondary | ICD-10-CM | POA: Insufficient documentation

## 2014-07-15 DIAGNOSIS — C49A3 Gastrointestinal stromal tumor of small intestine: Secondary | ICD-10-CM

## 2014-07-15 DIAGNOSIS — C494 Malignant neoplasm of connective and soft tissue of abdomen: Secondary | ICD-10-CM | POA: Insufficient documentation

## 2014-07-15 DIAGNOSIS — Q8500 Neurofibromatosis, unspecified: Secondary | ICD-10-CM | POA: Insufficient documentation

## 2014-07-15 DIAGNOSIS — D3502 Benign neoplasm of left adrenal gland: Secondary | ICD-10-CM | POA: Diagnosis not present

## 2014-07-15 LAB — CBC WITH DIFFERENTIAL (CANCER CENTER ONLY)
BASO#: 0 10*3/uL (ref 0.0–0.2)
BASO%: 0.5 % (ref 0.0–2.0)
EOS ABS: 0.5 10*3/uL (ref 0.0–0.5)
EOS%: 6.9 % (ref 0.0–7.0)
HCT: 37.3 % (ref 34.8–46.6)
HEMOGLOBIN: 11.9 g/dL (ref 11.6–15.9)
LYMPH#: 1.2 10*3/uL (ref 0.9–3.3)
LYMPH%: 17.8 % (ref 14.0–48.0)
MCH: 31 pg (ref 26.0–34.0)
MCHC: 31.9 g/dL — AB (ref 32.0–36.0)
MCV: 97 fL (ref 81–101)
MONO#: 0.4 10*3/uL (ref 0.1–0.9)
MONO%: 5.8 % (ref 0.0–13.0)
NEUT%: 69 % (ref 39.6–80.0)
NEUTROS ABS: 4.5 10*3/uL (ref 1.5–6.5)
PLATELETS: 220 10*3/uL (ref 145–400)
RBC: 3.84 10*6/uL (ref 3.70–5.32)
RDW: 14.6 % (ref 11.1–15.7)
WBC: 6.5 10*3/uL (ref 3.9–10.0)

## 2014-07-15 LAB — COMPREHENSIVE METABOLIC PANEL
ALBUMIN: 4.1 g/dL (ref 3.5–5.2)
ALT: 22 U/L (ref 0–35)
AST: 20 U/L (ref 0–37)
Alkaline Phosphatase: 82 U/L (ref 39–117)
BUN: 8 mg/dL (ref 6–23)
CALCIUM: 9.1 mg/dL (ref 8.4–10.5)
CHLORIDE: 111 meq/L (ref 96–112)
CO2: 27 meq/L (ref 19–32)
CREATININE: 0.71 mg/dL (ref 0.50–1.10)
Glucose, Bld: 88 mg/dL (ref 70–99)
POTASSIUM: 4.4 meq/L (ref 3.5–5.3)
Sodium: 144 mEq/L (ref 135–145)
TOTAL PROTEIN: 6.1 g/dL (ref 6.0–8.3)
Total Bilirubin: 0.5 mg/dL (ref 0.2–1.2)

## 2014-07-15 LAB — LACTATE DEHYDROGENASE: LDH: 171 U/L (ref 94–250)

## 2014-07-15 MED ORDER — IOHEXOL 300 MG/ML  SOLN
100.0000 mL | Freq: Once | INTRAMUSCULAR | Status: AC | PRN
  Administered 2014-07-15: 100 mL via INTRAVENOUS

## 2014-07-16 NOTE — Progress Notes (Signed)
Hematology and Oncology Follow Up Visit  Stephanie [name] [id number] [birth year] 58 y.o. 07/16/2014   Principle Diagnosis:  1.  Gastrointestinal stromal tumors  2. Neurofibromatosis  Current Therapy:   Gleevec 400 mg po q daily     Interim History:  Stephanie [name] is back for followup. We last saw her back in October. She did have a nice Thanksgiving. She was with her family. She is looking forward to Christmas. She comes in with her daughter.  She's doing pretty well. 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 She still enjoys working. She really physical great job.  We did go ahead and do a CT scan on her today. The CT scan did not show any obvious measurable disease in the abdomen or pelvis. She does have the neurofibromatosis. She has a stable left adrenal adenoma.   She has had no problems with the [place]. She's had no diarrhea. There's been no leg swelling. She's had no rashes. There's been no cough or shortness of breath. She's had no nausea or vomiting.  She does have neurofibromatosis. This has not been a problem for her.  She's had no bleeding or bruising. She's had no mouth sores. There's been no headache. She has had her left eye enucleated. Her right eye has been doing okay.  Medications: Current outpatient prescriptions: acetaminophen (TYLENOL) 500 MG tablet, Take 1,000 mg by mouth every 6 (six) hours as needed. For pain, Disp: , Rfl: ;  atorvastatin (LIPITOR) 80 MG tablet, Take 40 mg by mouth every evening. , Disp: , Rfl: ;  CALCIUM-VITAMIN D PO, Take 1 tablet by mouth 2 (two) times daily. , Disp: , Rfl: ;  ibuprofen (ADVIL,MOTRIN) 200 MG tablet, Take 400 mg by mouth every 6 (six) hours as needed for moderate pain., Disp: , Rfl:  imatinib (GLEEVEC) 400 MG tablet, Take 1 tablet (400 mg total) by mouth daily. Take with meals and large glass of water.Caution:Chemotherapy., Disp: 30 tablet, Rfl: 12;  loratadine (CLARITIN) 10 MG  tablet, Take 10 mg by mouth daily., Disp: , Rfl: ;  meloxicam (MOBIC) 15 MG tablet, Take 15 mg by mouth as needed for pain., Disp: , Rfl:  metoCLOPramide (REGLAN) 5 MG tablet, Take 1 tablet (5 mg total) by mouth every 6 (six) hours as needed for nausea or vomiting., Disp: 30 tablet, Rfl: 0;  pantoprazole (PROTONIX) 40 MG tablet, Take 40 mg by mouth every morning. , Disp: , Rfl: ;  Polyvinyl Alcohol-Povidone (REFRESH OP), Place 1 drop into both eyes as needed (dry eyes). , Disp: , Rfl:  oxyCODONE-acetaminophen (PERCOCET/ROXICET) 5-325 MG per tablet, Take 1 tablet by mouth every 4 (four) hours as needed for moderate pain. (Patient not taking: Reported on 07/15/2014), Disp: 30 tablet, Rfl: 0  Allergies: No Known Allergies  Past Medical History, Surgical history, Social history, and Family History were reviewed and updated.  Review of Systems: As above  Physical Exam:  height is 4\' 11"  (1.499 m) and weight is 122 lb (55.339 kg). Her oral temperature is 97.7 F (36.5 C). Her blood pressure is 119/63 and her pulse is 84. Her respiration is 14.   [name] in no obvious distress. Head and neck exam shows no ocular or oral lesions. Again, she has her left eye enucleated. Right eye has good extraocular muscle movement. There is no adenopathy in the neck. Lungs are clear. Cardiac exam regular rate and rhythm with no murmurs, rubs  or bruits.. Abdomen is soft. She has good bowel sounds. There is no fluid wave. There is no palpable liver or spleen tip. She is well-healed laparotomy scar. Back exam shows no tenderness over the spine, ribs or hips. Extremities shows no clubbing, cyanosis or edema. His good range most of her joints. She has good strength in her extremities. Skin exam shows the multiple neurofibromas. No rashes are noted. Neurological exam is nonfocal.  Lab Results  Component Value Date   WBC 6.5 07/15/2014   HGB 11.9 07/15/2014   HCT 37.3 07/15/2014   MCV 97 07/15/2014   PLT 220  07/15/2014     Chemistry      Component Value Date/Time   NA 144 07/15/2014 1051   K 4.4 07/15/2014 1051   CL 111 07/15/2014 1051   CO2 27 07/15/2014 1051   BUN 8 07/15/2014 1051   CREATININE 0.71 07/15/2014 1051      Component Value Date/Time   CALCIUM 9.1 07/15/2014 1051   ALKPHOS 82 07/15/2014 1051   AST 20 07/15/2014 1051   ALT 22 07/15/2014 1051   BILITOT 0.5 07/15/2014 1051         Impression and Plan: Stephanie [name] is 58 year old female with a GIST. Marland Kitchen This is metastatic. She did have resection of  tumor that was symptomatic. Her surgery was back in June 2015. She still has obvious disease that was not resected. However, what was left behind was not cause her any problems. We certainly cannot see anything on the recent scan.  She is on Gleevec.  Given how good the scan looks, I don't think she needs another scanned probably for 6 months.  I think we can get her back in about 3 or 4 months. I want to try to get her through the holidays in the wintertime.  I spent about 30 minutes with she and her daughter. I reviewed the CAT scan report. I went over her blood work with her.  Volanda Napoleon, MD 12/3/20157:33 AM

## 2014-07-20 ENCOUNTER — Other Ambulatory Visit: Payer: Self-pay

## 2014-07-20 ENCOUNTER — Ambulatory Visit (HOSPITAL_COMMUNITY)
Admission: RE | Admit: 2014-07-20 | Discharge: 2014-07-20 | Disposition: A | Discharge status: Home / Self Care | Payer: BC Managed Care – PPO | Source: Ambulatory Visit | Attending: Family Medicine | Admitting: Family Medicine

## 2014-07-20 ENCOUNTER — Encounter: Payer: Self-pay | Admitting: Gastroenterology

## 2014-07-20 ENCOUNTER — Ambulatory Visit (INDEPENDENT_AMBULATORY_CARE_PROVIDER_SITE_OTHER): Payer: BC Managed Care – PPO | Admitting: Gastroenterology

## 2014-07-20 VITALS — BP 135/79 | HR 82 | Temp 97.3°F | Ht 59.0 in | Wt 124.0 lb

## 2014-07-20 DIAGNOSIS — C179 Malignant neoplasm of small intestine, unspecified: Secondary | ICD-10-CM

## 2014-07-20 DIAGNOSIS — Z1231 Encounter for screening mammogram for malignant neoplasm of breast: Secondary | ICD-10-CM

## 2014-07-20 DIAGNOSIS — C49A0 Gastrointestinal stromal tumor, unspecified site: Secondary | ICD-10-CM

## 2014-07-20 DIAGNOSIS — C49A3 Gastrointestinal stromal tumor of small intestine: Secondary | ICD-10-CM

## 2014-07-20 NOTE — Patient Instructions (Signed)
1. Upper endoscopy as scheduled. See separate instructions.  
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
## 2014-07-21 NOTE — Progress Notes (Signed)
cc'ed to pcp °

## 2014-07-21 NOTE — Assessment & Plan Note (Signed)
58 y/o female with h/o neurofibromatosis, malignant GIST on Gleevec who presents for follow up EGD at the recommendations of her surgeon and oncologist. In 01/2014, she had two duodenal, 1 jejunal masses removed c/w GIST. Numerous other masses within the small bowel were not removed and unresectable due to scattered nature. It is not clear if the other masses were seen on serosal side only. Clinically patient is doing well other than some early satiety. She had recent CT with no evidence of intraabdominal masses. Discussed with Dr. Gala Romney. Proceed with EGD.  I have discussed the risks, alternatives, benefits with regards to but not limited to the risk of reaction to medication, bleeding, infection, perforation and the patient is agreeable to proceed. Written consent to be obtained.

## 2014-07-22 ENCOUNTER — Encounter (HOSPITAL_COMMUNITY)
Admission: RE | Disposition: A | Discharge status: Home / Self Care | Payer: Self-pay | Source: Ambulatory Visit | Attending: Internal Medicine

## 2014-07-22 ENCOUNTER — Encounter (HOSPITAL_COMMUNITY): Payer: Self-pay

## 2014-07-22 ENCOUNTER — Ambulatory Visit (HOSPITAL_COMMUNITY)
Admission: RE | Admit: 2014-07-22 | Discharge: 2014-07-22 | Disposition: A | Discharge status: Home / Self Care | Payer: BC Managed Care – PPO | Source: Ambulatory Visit | Attending: Internal Medicine | Admitting: Internal Medicine

## 2014-07-22 DIAGNOSIS — Z791 Long term (current) use of non-steroidal anti-inflammatories (NSAID): Secondary | ICD-10-CM | POA: Diagnosis not present

## 2014-07-22 DIAGNOSIS — C499 Malignant neoplasm of connective and soft tissue, unspecified: Secondary | ICD-10-CM

## 2014-07-22 DIAGNOSIS — C49A0 Gastrointestinal stromal tumor, unspecified site: Secondary | ICD-10-CM

## 2014-07-22 DIAGNOSIS — C494 Malignant neoplasm of connective and soft tissue of abdomen: Secondary | ICD-10-CM | POA: Diagnosis present

## 2014-07-22 DIAGNOSIS — Z9889 Other specified postprocedural states: Secondary | ICD-10-CM | POA: Diagnosis not present

## 2014-07-22 DIAGNOSIS — K449 Diaphragmatic hernia without obstruction or gangrene: Secondary | ICD-10-CM | POA: Diagnosis not present

## 2014-07-22 DIAGNOSIS — E780 Pure hypercholesterolemia: Secondary | ICD-10-CM | POA: Insufficient documentation

## 2014-07-22 DIAGNOSIS — K219 Gastro-esophageal reflux disease without esophagitis: Secondary | ICD-10-CM | POA: Insufficient documentation

## 2014-07-22 DIAGNOSIS — Z859 Personal history of malignant neoplasm, unspecified: Secondary | ICD-10-CM | POA: Insufficient documentation

## 2014-07-22 HISTORY — PX: ESOPHAGOGASTRODUODENOSCOPY: SHX5428

## 2014-07-22 SURGERY — EGD (ESOPHAGOGASTRODUODENOSCOPY)
Anesthesia: Moderate Sedation

## 2014-07-22 MED ORDER — LIDOCAINE VISCOUS 2 % MT SOLN
OROMUCOSAL | Status: DC | PRN
  Administered 2014-07-22: 3 mL via OROMUCOSAL

## 2014-07-22 MED ORDER — ONDANSETRON HCL 4 MG/2ML IJ SOLN
INTRAMUSCULAR | Status: DC | PRN
  Administered 2014-07-22: 4 mg via INTRAVENOUS

## 2014-07-22 MED ORDER — SODIUM CHLORIDE 0.9 % IV SOLN
INTRAVENOUS | Status: DC
  Administered 2014-07-22: 11:00:00 via INTRAVENOUS

## 2014-07-22 MED ORDER — MIDAZOLAM HCL 5 MG/5ML IJ SOLN
INTRAMUSCULAR | Status: DC | PRN
Start: 2014-07-22 — End: 2014-07-22
  Administered 2014-07-22: 2 mg via INTRAVENOUS
  Administered 2014-07-22: 1 mg via INTRAVENOUS
  Administered 2014-07-22: 2 mg via INTRAVENOUS

## 2014-07-22 MED ORDER — LIDOCAINE VISCOUS 2 % MT SOLN
OROMUCOSAL | Status: AC
  Filled 2014-07-22: qty 15

## 2014-07-22 MED ORDER — STERILE WATER FOR IRRIGATION IR SOLN
Status: DC | PRN
  Administered 2014-07-22: 11:00:00

## 2014-07-22 MED ORDER — MEPERIDINE HCL 100 MG/ML IJ SOLN
INTRAMUSCULAR | Status: DC | PRN
  Administered 2014-07-22: 50 mg via INTRAVENOUS
  Administered 2014-07-22: 25 mg via INTRAVENOUS

## 2014-07-22 MED ORDER — ONDANSETRON HCL 4 MG/2ML IJ SOLN
INTRAMUSCULAR | Status: AC
  Filled 2014-07-22: qty 2

## 2014-07-22 MED ORDER — MIDAZOLAM HCL 5 MG/5ML IJ SOLN
INTRAMUSCULAR | Status: AC
  Filled 2014-07-22: qty 10

## 2014-07-22 MED ORDER — MEPERIDINE HCL 100 MG/ML IJ SOLN
INTRAMUSCULAR | Status: AC
  Filled 2014-07-22: qty 2

## 2014-07-22 NOTE — Interval H&P Note (Signed)
History and Physical Interval Note:  07/22/2014 11:18 AM  Harriette Bouillon  has presented today for surgery, with the diagnosis of malignant GIST  The various methods of treatment have been discussed with the patient and family. After consideration of risks, benefits and other options for treatment, the patient has consented to  Procedure(s) with comments: ESOPHAGOGASTRODUODENOSCOPY (EGD) (N/A) - 145 - moved to 11:00 - Ginger notified pt  as a surgical intervention .  The patient's history has been reviewed, patient examined, no change in status, stable for surgery.  I have reviewed the patient's chart and labs.  Questions were answered to the patient's satisfaction.     Shayne Deerman  No change. EGD per plan. The risks, benefits, limitations, alternatives and imponderables have been reviewed with the patient. Potential for esophageal dilation, biopsy, etc. have also been reviewed.  Questions have been answered. All parties agreeable.

## 2014-07-22 NOTE — Discharge Instructions (Signed)
EGD Discharge instructions Please read the instructions outlined below and refer to this sheet in the next few weeks. These discharge instructions provide you with general information on caring for yourself after you leave the hospital. Your doctor may also give you specific instructions. While your treatment has been planned according to the most current medical practices available, unavoidable complications occasionally occur. If you have any problems or questions after discharge, please call your doctor. ACTIVITY  You may resume your regular activity but move at a slower pace for the next 24 hours.   Take frequent rest periods for the next 24 hours.   Walking will help expel (get rid of) the air and reduce the bloated feeling in your abdomen.   No driving for 24 hours (because of the anesthesia (medicine) used during the test).   You may shower.   Do not sign any important legal documents or operate any machinery for 24 hours (because of the anesthesia used during the test).  NUTRITION  Drink plenty of fluids.   You may resume your normal diet.   Begin with a light meal and progress to your normal diet.   Avoid alcoholic beverages for 24 hours or as instructed by your caregiver.  MEDICATIONS  You may resume your normal medications unless your caregiver tells you otherwise.  WHAT YOU CAN EXPECT TODAY  You may experience abdominal discomfort such as a feeling of fullness or gas pains.  FOLLOW-UP  Your doctor will discuss the results of your test with you.  SEEK IMMEDIATE MEDICAL ATTENTION IF ANY OF THE FOLLOWING OCCUR:  Excessive nausea (feeling sick to your stomach) and/or vomiting.   Severe abdominal pain and distention (swelling).   Trouble swallowing.   Temperature over 101 F (37.8 C).   Rectal bleeding or vomiting of blood.    Continue Protonix 40 mg daily  Follow-up with Dr. Marin Olp as scheduled

## 2014-07-22 NOTE — Op Note (Signed)
[facility] [address] [place], [address]   ENDOSCOPY PROCEDURE REPORT  PATIENT: Stephanie [name], Stephanie [name]  MR#: [id number] BIRTHDATE: [birth year] , 55  yrs. old GENDER: female ENDOSCOPIST: R.  Garfield Cornea, MD FACP FACG REFERRED BY:  Burney Gauze, M.D.  Stark Klein, M.D.  Kerin Perna, M.D. 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  Dr.  Marin Olp has requested reassessment of her luminal GI tract. MEDICATIONS: Versed 5 mg IV and Demerol 75 mg IV in divided doses. Xylocaine gel orally.  Zofran 4 mg IV. ASA CLASS:      Class III  CONSENT: The risks, benefits, limitations, alternatives and imponderables have been discussed.  The potential for biopsy, esophogeal dilation, etc. have also been reviewed.  Questions have been answered.  All parties agreeable.  Please see the history and physical in the medical record for more information.  DESCRIPTION OF PROCEDURE: After the risks benefits and alternatives of the procedure were thoroughly explained, informed consent was obtained.  The [id number] ([id number]) endoscope was introduced through the mouth and advanced to the second portion of the duodenum , limited by Without limitations. The instrument was slowly withdrawn as the mucosa was fully examined.    Normal-appearing esophagus.  Stomach empty.  Small hiatal hernia. Couple of antral erosions.  No evidence of ulcer or infiltrating process.  Pylorus patent.  Examination of the first, second and third portion of the duodenum revealed some postsurgical changes at the junction of the bulb and second portion.  However, I saw no evidence of intraluminal neoplasm.  Retroflexed views revealed as previously described.     The scope was then withdrawn from the patient and the procedure completed.  COMPLICATIONS: There were no immediate  complications.  ENDOSCOPIC IMPRESSION: Small hiatal hernia. Antral erosions. Surgical changes in the duodenum as described. No evidence of persistent or recurrent tumor  RECOMMENDATIONS: Continue Protonix 40 mg daily. Follow-up with Drs. Ennever and [place] as scheduled.  REPEAT EXAM:  eSigned:  R. Garfield Cornea, MD Rosalita [facility]ss[facility] 08/04/14 11:52 AM    CC:  CPT CODES: ICD CODES:  The ICD and CPT codes recommended by this software are interpretations from the data that the clinical staff has captured with the software.  The verification of the translation of this report to the ICD and CPT codes and modifiers is the sole responsibility of the health care institution and practicing physician where this report was generated.  [facility] will not be held responsible for the validity of the ICD and CPT codes included on this report.  AMA assumes no liability for data contained or not contained herein. CPT is a Designer, television/film set of the [employer].  PATIENT NAME:  Stephanie [name], Stephanie [name] MR#: [id number]
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
## 2014-07-23 ENCOUNTER — Encounter (HOSPITAL_COMMUNITY): Payer: Self-pay | Admitting: Internal Medicine

## 2014-09-01 ENCOUNTER — Ambulatory Visit (HOSPITAL_COMMUNITY)
Admission: RE | Admit: 2014-09-01 | Discharge: 2014-09-01 | Disposition: A | Discharge status: Home / Self Care | Payer: BLUE CROSS/BLUE SHIELD | Source: Ambulatory Visit | Attending: Family Medicine | Admitting: Family Medicine

## 2014-09-01 ENCOUNTER — Other Ambulatory Visit (HOSPITAL_COMMUNITY): Payer: Self-pay | Admitting: Family Medicine

## 2014-09-01 DIAGNOSIS — S93402A Sprain of unspecified ligament of left ankle, initial encounter: Secondary | ICD-10-CM

## 2014-09-01 DIAGNOSIS — M25572 Pain in left ankle and joints of left foot: Secondary | ICD-10-CM | POA: Insufficient documentation

## 2014-10-28 ENCOUNTER — Other Ambulatory Visit (HOSPITAL_COMMUNITY): Payer: Self-pay | Admitting: Family Medicine

## 2014-10-28 ENCOUNTER — Other Ambulatory Visit: Payer: Self-pay

## 2014-10-28 DIAGNOSIS — M8580 Other specified disorders of bone density and structure, unspecified site: Secondary | ICD-10-CM

## 2014-10-28 DIAGNOSIS — E2839 Other primary ovarian failure: Secondary | ICD-10-CM

## 2014-10-28 MED ORDER — PANTOPRAZOLE SODIUM 40 MG PO TBEC: 40.0000 mg | DELAYED_RELEASE_TABLET | ORAL | Status: DC

## 2014-11-02 ENCOUNTER — Ambulatory Visit (HOSPITAL_COMMUNITY)
Admission: RE | Admit: 2014-11-02 | Discharge: 2014-11-02 | Disposition: A | Discharge status: Home / Self Care | Payer: BLUE CROSS/BLUE SHIELD | Source: Ambulatory Visit | Attending: Family Medicine | Admitting: Family Medicine

## 2014-11-02 DIAGNOSIS — M8580 Other specified disorders of bone density and structure, unspecified site: Secondary | ICD-10-CM

## 2014-11-02 DIAGNOSIS — M859 Disorder of bone density and structure, unspecified: Secondary | ICD-10-CM | POA: Diagnosis present

## 2014-11-02 DIAGNOSIS — E2839 Other primary ovarian failure: Secondary | ICD-10-CM | POA: Diagnosis not present

## 2014-11-11 ENCOUNTER — Ambulatory Visit (HOSPITAL_BASED_OUTPATIENT_CLINIC_OR_DEPARTMENT_OTHER): Payer: BLUE CROSS/BLUE SHIELD | Admitting: Hematology & Oncology

## 2014-11-11 ENCOUNTER — Other Ambulatory Visit (HOSPITAL_BASED_OUTPATIENT_CLINIC_OR_DEPARTMENT_OTHER): Payer: BLUE CROSS/BLUE SHIELD

## 2014-11-11 ENCOUNTER — Encounter: Payer: Self-pay | Admitting: Hematology & Oncology

## 2014-11-11 VITALS — BP 124/63 | HR 82 | Temp 97.2°F | Resp 14 | Ht 59.0 in | Wt 119.0 lb

## 2014-11-11 DIAGNOSIS — C49A3 Gastrointestinal stromal tumor of small intestine: Secondary | ICD-10-CM

## 2014-11-11 DIAGNOSIS — C494 Malignant neoplasm of connective and soft tissue of abdomen: Secondary | ICD-10-CM | POA: Diagnosis not present

## 2014-11-11 LAB — CBC WITH DIFFERENTIAL (CANCER CENTER ONLY)
BASO#: 0 10*3/uL (ref 0.0–0.2)
BASO%: 0.8 % (ref 0.0–2.0)
EOS%: 5 % (ref 0.0–7.0)
Eosinophils Absolute: 0.3 10*3/uL (ref 0.0–0.5)
HEMATOCRIT: 34.7 % — AB (ref 34.8–46.6)
HEMOGLOBIN: 11.2 g/dL — AB (ref 11.6–15.9)
LYMPH#: 1 10*3/uL (ref 0.9–3.3)
LYMPH%: 19.8 % (ref 14.0–48.0)
MCH: 31.5 pg (ref 26.0–34.0)
MCHC: 32.3 g/dL (ref 32.0–36.0)
MCV: 98 fL (ref 81–101)
MONO#: 0.3 10*3/uL (ref 0.1–0.9)
MONO%: 5.6 % (ref 0.0–13.0)
NEUT#: 3.6 10*3/uL (ref 1.5–6.5)
NEUT%: 68.8 % (ref 39.6–80.0)
PLATELETS: 212 10*3/uL (ref 145–400)
RBC: 3.56 10*6/uL — ABNORMAL LOW (ref 3.70–5.32)
RDW: 14.2 % (ref 11.1–15.7)
WBC: 5.2 10*3/uL (ref 3.9–10.0)

## 2014-11-11 LAB — COMPREHENSIVE METABOLIC PANEL (CC13)
ALBUMIN: 3.8 g/dL (ref 3.5–5.0)
ALT: 24 U/L (ref 0–55)
AST: 22 U/L (ref 5–34)
Alkaline Phosphatase: 75 U/L (ref 40–150)
Anion Gap: 11 mEq/L (ref 3–11)
BILIRUBIN TOTAL: 0.57 mg/dL (ref 0.20–1.20)
BUN: 7.3 mg/dL (ref 7.0–26.0)
CHLORIDE: 110 meq/L — AB (ref 98–109)
CO2: 26 mEq/L (ref 22–29)
CREATININE: 0.7 mg/dL (ref 0.6–1.1)
Calcium: 8.6 mg/dL (ref 8.4–10.4)
EGFR: >90 mL/min/{1.73_m2} (ref >90)
GLUCOSE: 100 mg/dL (ref 70–140)
Potassium: 3.7 mEq/L (ref 3.5–5.1)
Sodium: 147 mEq/L — ABNORMAL HIGH (ref 136–145)
Total Protein: 5.9 g/dL — ABNORMAL LOW (ref 6.4–8.3)

## 2014-11-11 LAB — LACTATE DEHYDROGENASE: LDH: 172 U/L (ref 94–250)

## 2014-11-11 NOTE — Progress Notes (Signed)
Hematology and Oncology Follow Up Visit  Stephanie [name] [id number] 1955-09-27 59 y.o. 11/11/2014   Principle Diagnosis:  1.  Gastrointestinal stromal tumors  2. Neurofibromatosis  Current Therapy:   Gleevec 400 mg po q daily     Interim History:  [name] is back for followup. We last saw her back in December. She had scans done area did everything looked okay on the scans area and there is no evidence of obvious metastatic disease although she did have disease left behind at the time of surgery.   She had a good Easter. She is working at the retirement home. She has had no problems with work.  She has lost a little bit of weight. She said that her weight does fluctuate.  She does have neurofibromatosis. This has not been a problem for her.  She's had no bleeding or bruising. She did bump the right forearm on a medicine cart over the weekend. She had a little bit of bleeding but this stopped without any problems.  She's had no mouth sores. There's been no headache. She has had her left eye enucleated. Her right eye has been doing okay.  Medications:  Current outpatient prescriptions:  .  acetaminophen (TYLENOL) 500 MG tablet, Take 1,000 mg by mouth every 6 (six) hours as needed. For pain, Disp: , Rfl:  .  alendronate (FOSAMAX) 40 MG tablet, Take 40 mg by mouth every 7 (seven) days. Take with a full glass of water on an empty stomach. NOT SURE OF CORRECT STRENGTH 11-11-14, Disp: , Rfl:  .  atorvastatin (LIPITOR) 80 MG tablet, Take 40 mg by mouth every evening. , Disp: , Rfl:  .  CALCIUM-VITAMIN D PO, Take 1 tablet by mouth 2 (two) times daily. , Disp: , Rfl:  .  ibuprofen (ADVIL,MOTRIN) 200 MG tablet, Take 400 mg by mouth every 6 (six) hours as needed for moderate pain., Disp: , Rfl:  .  imatinib (GLEEVEC) 400 MG tablet, Take 1 tablet (400 mg total) by mouth daily. Take with meals and large glass of water.Caution:Chemotherapy., Disp: 30 tablet, Rfl: 12 .  Melatonin 3 MG TBDP,  Take by mouth as needed., Disp: , Rfl:  .  meloxicam (MOBIC) 15 MG tablet, Take 15 mg by mouth as needed for pain., Disp: , Rfl:  .  metoCLOPramide (REGLAN) 5 MG tablet, Take 1 tablet (5 mg total) by mouth every 6 (six) hours as needed for nausea or vomiting., Disp: 30 tablet, Rfl: 0 .  oxyCODONE-acetaminophen (PERCOCET/ROXICET) 5-325 MG per tablet, Take 1 tablet by mouth every 4 (four) hours as needed for moderate pain., Disp: 30 tablet, Rfl: 0 .  pantoprazole (PROTONIX) 40 MG tablet, Take 1 tablet (40 mg total) by mouth every morning., Disp: 90 tablet, Rfl: 3 .  Polyvinyl Alcohol-Povidone (REFRESH OP), Place 1 drop into both eyes as needed (dry eyes). , Disp: , Rfl:   Allergies: No Known Allergies  Past Medical History, Surgical history, Social history, and Family History were reviewed and updated.  Review of Systems: As above  Physical Exam:  height is 4\' 11"  (1.499 m) and weight is 119 lb (53.978 kg). Her oral temperature is 97.2 F (36.2 C). Her blood pressure is 124/63 and her pulse is 82. Her respiration is 14.   [name] in no obvious distress. Head and neck exam shows no ocular or oral lesions. Again, she has her left eye enucleated. Right eye has good extraocular muscle movement. There is no adenopathy in  the neck. Lungs are clear. Cardiac exam regular rate and rhythm with no murmurs, rubs or bruits.. Abdomen is soft. She has good bowel sounds. There is no fluid wave. There is no palpable liver or spleen tip. She is well-healed laparotomy scar. Back exam shows no tenderness over the spine, ribs or hips. Extremities shows no clubbing, cyanosis or edema. His good range most of her joints. She has good strength in her extremities. Skin exam shows the multiple neurofibromas. No rashes are noted. Neurological exam is nonfocal.  Lab Results  Component Value Date   WBC 5.2 11/11/2014   HGB 11.2* 11/11/2014   HCT 34.7* 11/11/2014   MCV 98 11/11/2014   PLT 212 11/11/2014      Chemistry      Component Value Date/Time   NA 144 07/15/2014 1051   K 4.4 07/15/2014 1051   CL 111 07/15/2014 1051   CO2 27 07/15/2014 1051   BUN 8 07/15/2014 1051   CREATININE 0.71 07/15/2014 1051      Component Value Date/Time   CALCIUM 9.1 07/15/2014 1051   ALKPHOS 82 07/15/2014 1051   AST 20 07/15/2014 1051   ALT 22 07/15/2014 1051   BILITOT 0.5 07/15/2014 1051         Impression and Plan: Stephanie [name] is 59 year old female with a GIST. Marland Kitchen This is metastatic. She did have resection of  tumor that was symptomatic. Her surgery was back in June 2015. She still had obvious disease that was not resected. However, what was left behind was not causing her any problems. We certainly cannot see anything on the recent scan.  She is on Gleevec.  I will plan for another CT scan we see her back. I will plan to see her back in 3 months.   Stephanie Napoleon, MD 3/30/201611:05 AM

## 2015-01-27 ENCOUNTER — Other Ambulatory Visit (HOSPITAL_BASED_OUTPATIENT_CLINIC_OR_DEPARTMENT_OTHER): Payer: BLUE CROSS/BLUE SHIELD

## 2015-01-27 ENCOUNTER — Encounter (HOSPITAL_BASED_OUTPATIENT_CLINIC_OR_DEPARTMENT_OTHER): Payer: Self-pay

## 2015-01-27 ENCOUNTER — Ambulatory Visit (HOSPITAL_BASED_OUTPATIENT_CLINIC_OR_DEPARTMENT_OTHER)
Admission: RE | Admit: 2015-01-27 | Discharge: 2015-01-27 | Disposition: A | Discharge status: Home / Self Care | Payer: BLUE CROSS/BLUE SHIELD | Source: Ambulatory Visit | Attending: Hematology & Oncology | Admitting: Hematology & Oncology

## 2015-01-27 ENCOUNTER — Encounter: Payer: Self-pay | Admitting: Hematology & Oncology

## 2015-01-27 ENCOUNTER — Ambulatory Visit (HOSPITAL_BASED_OUTPATIENT_CLINIC_OR_DEPARTMENT_OTHER): Payer: BLUE CROSS/BLUE SHIELD | Admitting: Hematology & Oncology

## 2015-01-27 VITALS — BP 125/77 | HR 77 | Temp 97.6°F | Resp 14 | Ht 59.0 in | Wt 118.0 lb

## 2015-01-27 DIAGNOSIS — M62830 Muscle spasm of back: Secondary | ICD-10-CM

## 2015-01-27 DIAGNOSIS — Z79899 Other long term (current) drug therapy: Secondary | ICD-10-CM | POA: Insufficient documentation

## 2015-01-27 DIAGNOSIS — C49A3 Gastrointestinal stromal tumor of small intestine: Secondary | ICD-10-CM

## 2015-01-27 DIAGNOSIS — C499 Malignant neoplasm of connective and soft tissue, unspecified: Secondary | ICD-10-CM

## 2015-01-27 DIAGNOSIS — Z9889 Other specified postprocedural states: Secondary | ICD-10-CM | POA: Insufficient documentation

## 2015-01-27 DIAGNOSIS — C179 Malignant neoplasm of small intestine, unspecified: Secondary | ICD-10-CM | POA: Insufficient documentation

## 2015-01-27 LAB — CBC WITH DIFFERENTIAL (CANCER CENTER ONLY)
BASO#: 0 10*3/uL (ref 0.0–0.2)
BASO%: 0.7 % (ref 0.0–2.0)
EOS%: 5.1 % (ref 0.0–7.0)
Eosinophils Absolute: 0.3 10*3/uL (ref 0.0–0.5)
HCT: 36.7 % (ref 34.8–46.6)
HGB: 11.9 g/dL (ref 11.6–15.9)
LYMPH#: 1.1 10*3/uL (ref 0.9–3.3)
LYMPH%: 18.8 % (ref 14.0–48.0)
MCH: 32 pg (ref 26.0–34.0)
MCHC: 32.4 g/dL (ref 32.0–36.0)
MCV: 99 fL (ref 81–101)
MONO#: 0.4 10*3/uL (ref 0.1–0.9)
MONO%: 6.6 % (ref 0.0–13.0)
NEUT#: 4.2 10*3/uL (ref 1.5–6.5)
NEUT%: 68.8 % (ref 39.6–80.0)
PLATELETS: 193 10*3/uL (ref 145–400)
RBC: 3.72 10*6/uL (ref 3.70–5.32)
RDW: 13.5 % (ref 11.1–15.7)
WBC: 6.1 10*3/uL (ref 3.9–10.0)

## 2015-01-27 LAB — COMPREHENSIVE METABOLIC PANEL
ALBUMIN: 4 g/dL (ref 3.5–5.2)
ALK PHOS: 62 U/L (ref 39–117)
ALT: 13 U/L (ref 0–35)
AST: 15 U/L (ref 0–37)
BUN: 11 mg/dL (ref 6–23)
CALCIUM: 9.5 mg/dL (ref 8.4–10.5)
CHLORIDE: 106 meq/L (ref 96–112)
CO2: 29 mEq/L (ref 19–32)
Creatinine, Ser: 0.74 mg/dL (ref 0.50–1.10)
Glucose, Bld: 96 mg/dL (ref 70–99)
POTASSIUM: 4.7 meq/L (ref 3.5–5.3)
Sodium: 142 mEq/L (ref 135–145)
Total Bilirubin: 0.5 mg/dL (ref 0.2–1.2)
Total Protein: 6.1 g/dL (ref 6.0–8.3)

## 2015-01-27 LAB — LACTATE DEHYDROGENASE: LDH: 162 U/L (ref 94–250)

## 2015-01-27 MED ORDER — ORPHENADRINE CITRATE ER 100 MG PO TB12: ORAL_TABLET | ORAL | Status: DC

## 2015-01-27 MED ORDER — IOHEXOL 300 MG/ML  SOLN
100.0000 mL | Freq: Once | INTRAMUSCULAR | Status: AC | PRN
  Administered 2015-01-27: 100 mL via INTRAVENOUS

## 2015-01-27 NOTE — Progress Notes (Signed)
Hematology and Oncology Follow Up Visit  Stephanie [name] [id number] February 13, 1956 59 y.o. 01/27/2015   Principle Diagnosis:  1.  Gastrointestinal stromal tumors  2. Neurofibromatosis  Current Therapy:   Gleevec 400 mg po q daily     Interim History:  [name] is back for followup. We last saw her back in March. Per she is still doing quite well. She did twist and hurt her right sacroiliac area. I did give her a prescription for some Norflex (100 mg by mouth twice a day when necessary) to try to help.   we did go ahead and get a CT scan on her. The CT scan did not show any evidence of metastatic/recurrent GIST. She did have benign-appearing meningocele in the chest. It measured 5.3 x 3.7 cm. It lies at T5-T6. There is no evidence of tumor that looked malignant.  She's had no problems with bowels or bladder. She's had no infections. She's had no fever.  She is still working at the assisted living center. 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    Medications:  Current outpatient prescriptions:  .  acetaminophen (TYLENOL) 500 MG tablet, Take 1,000 mg by mouth every 6 (six) hours as needed. For pain, Disp: , Rfl:  .  alendronate (FOSAMAX) 40 MG tablet, Take 40 mg by mouth every 7 (seven) days. Take with a full glass of water on an empty stomach. NOT SURE OF CORRECT STRENGTH 11-11-14, Disp: , Rfl:  .  atorvastatin (LIPITOR) 80 MG tablet, Take 40 mg by mouth every evening. , Disp: , Rfl:  .  CALCIUM-VITAMIN D PO, Take 1 tablet by mouth 2 (two) times daily. , Disp: , Rfl:  .  ibuprofen (ADVIL,MOTRIN) 200 MG tablet, Take 400 mg by mouth every 6 (six) hours as needed for moderate pain., Disp: , Rfl:  .  imatinib (GLEEVEC) 400 MG tablet, Take 1 tablet (400 mg total) by mouth daily. Take with meals and large glass of water.Caution:Chemotherapy., Disp: 30 tablet, Rfl: 12 .  Melatonin 3 MG TBDP, Take by mouth as needed., Disp: , Rfl:  .  meloxicam (MOBIC)  15 MG tablet, Take 15 mg by mouth as needed for pain., Disp: , Rfl:  .  oxyCODONE-acetaminophen (PERCOCET/ROXICET) 5-325 MG per tablet, Take 1 tablet by mouth every 4 (four) hours as needed for moderate pain., Disp: 30 tablet, Rfl: 0 .  pantoprazole (PROTONIX) 40 MG tablet, Take 1 tablet (40 mg total) by mouth every morning., Disp: 90 tablet, Rfl: 3 .  Polyvinyl Alcohol-Povidone (REFRESH OP), Place 1 drop into both eyes as needed (dry eyes). , Disp: , Rfl:  .  metoCLOPramide (REGLAN) 5 MG tablet, Take 1 tablet (5 mg total) by mouth every 6 (six) hours as needed for nausea or vomiting. (Patient not taking: Reported on 01/27/2015), Disp: 30 tablet, Rfl: 0 .  orphenadrine (NORFLEX) 100 MG tablet, Please take 1 tablet, if needed, twice a day for muscle spasms., Disp: 60 tablet, Rfl: 0  Allergies: No Known Allergies  Past Medical History, Surgical history, Social history, and Family History were reviewed and updated.  Review of Systems: As above  Physical Exam:  height is 4\' 11"  (1.499 m) and weight is 118 lb (53.524 kg). Her oral temperature is 97.6 F (36.4 C). Her blood pressure is 125/77 and her pulse is 77. Her respiration is 14.   [name] in no obvious distress. Head and neck exam shows no ocular or  oral lesions. Again, she has her left eye enucleated. Right eye has good extraocular muscle movement. There is no adenopathy in the neck. Lungs are clear. Cardiac exam regular rate and rhythm with no murmurs, rubs or bruits.. Abdomen is soft. She has good bowel sounds. There is no fluid wave. There is no palpable liver or spleen tip. She is well-healed laparotomy scar. Back exam shows no tenderness over the spine, ribs or hips. Extremities shows no clubbing, cyanosis or edema. His good range most of her joints. She has good strength in her extremities. Skin exam shows the multiple neurofibromas. No rashes are noted. Neurological exam is nonfocal.  Lab Results  Component Value Date   WBC  6.1 01/27/2015   HGB 11.9 01/27/2015   HCT 36.7 01/27/2015   MCV 99 01/27/2015   PLT 193 01/27/2015     Chemistry      Component Value Date/Time   NA 147* 11/11/2014 1022   NA 144 07/15/2014 1051   K 3.7 11/11/2014 1022   K 4.4 07/15/2014 1051   CL 111 07/15/2014 1051   CO2 26 11/11/2014 1022   CO2 27 07/15/2014 1051   BUN 7.3 11/11/2014 1022   BUN 8 07/15/2014 1051   CREATININE 0.7 11/11/2014 1022   CREATININE 0.71 07/15/2014 1051      Component Value Date/Time   CALCIUM 8.6 11/11/2014 1022   CALCIUM 9.1 07/15/2014 1051   ALKPHOS 75 11/11/2014 1022   ALKPHOS 82 07/15/2014 1051   AST 22 11/11/2014 1022   AST 20 07/15/2014 1051   ALT 24 11/11/2014 1022   ALT 22 07/15/2014 1051   BILITOT 0.57 11/11/2014 1022   BILITOT 0.5 07/15/2014 1051         Impression and Plan: Stephanie [name] is 59 year old female with a GIST. Marland Kitchen This is metastatic. She did have resection of  tumor that was symptomatic. Her surgery was back in June 2015. She still had obvious disease that was not resected. However, what was left behind was not causing her any problems. We certainly cannot see anything on the recent scan.  She is on Gleevec. I will maintain Gleevec.  I do not think that we need any scans for 6 months.  I will plan to see her back in 3 months.      Volanda Napoleon, MD 6/15/201611:37 AM

## 2015-04-29 ENCOUNTER — Encounter: Payer: Self-pay | Admitting: Family

## 2015-04-29 ENCOUNTER — Ambulatory Visit (HOSPITAL_BASED_OUTPATIENT_CLINIC_OR_DEPARTMENT_OTHER): Payer: BLUE CROSS/BLUE SHIELD | Admitting: Family

## 2015-04-29 ENCOUNTER — Other Ambulatory Visit (HOSPITAL_BASED_OUTPATIENT_CLINIC_OR_DEPARTMENT_OTHER): Payer: BLUE CROSS/BLUE SHIELD

## 2015-04-29 VITALS — BP 133/59 | HR 69 | Temp 97.7°F | Resp 16 | Ht 59.0 in | Wt 120.0 lb

## 2015-04-29 DIAGNOSIS — C179 Malignant neoplasm of small intestine, unspecified: Secondary | ICD-10-CM | POA: Diagnosis not present

## 2015-04-29 DIAGNOSIS — C49A3 Gastrointestinal stromal tumor of small intestine: Secondary | ICD-10-CM

## 2015-04-29 DIAGNOSIS — M62830 Muscle spasm of back: Secondary | ICD-10-CM

## 2015-04-29 LAB — CBC WITH DIFFERENTIAL (CANCER CENTER ONLY)
BASO#: 0 10*3/uL (ref 0.0–0.2)
BASO%: 0.6 % (ref 0.0–2.0)
EOS ABS: 0.3 10*3/uL (ref 0.0–0.5)
EOS%: 4.1 % (ref 0.0–7.0)
HCT: 35.9 % (ref 34.8–46.6)
HGB: 11.5 g/dL — ABNORMAL LOW (ref 11.6–15.9)
LYMPH#: 1.3 10*3/uL (ref 0.9–3.3)
LYMPH%: 18.9 % (ref 14.0–48.0)
MCH: 31.3 pg (ref 26.0–34.0)
MCHC: 32 g/dL (ref 32.0–36.0)
MCV: 98 fL (ref 81–101)
MONO#: 0.5 10*3/uL (ref 0.1–0.9)
MONO%: 7.8 % (ref 0.0–13.0)
NEUT#: 4.7 10*3/uL (ref 1.5–6.5)
NEUT%: 68.6 % (ref 39.6–80.0)
PLATELETS: 176 10*3/uL (ref 145–400)
RBC: 3.67 10*6/uL — ABNORMAL LOW (ref 3.70–5.32)
RDW: 14 % (ref 11.1–15.7)
WBC: 6.8 10*3/uL (ref 3.9–10.0)

## 2015-04-29 LAB — COMPREHENSIVE METABOLIC PANEL (CC13)
ALT: 13 U/L (ref 0–55)
ANION GAP: 4 meq/L (ref 3–11)
AST: 15 U/L (ref 5–34)
Albumin: 3.6 g/dL (ref 3.5–5.0)
Alkaline Phosphatase: 52 U/L (ref 40–150)
BILIRUBIN TOTAL: 0.36 mg/dL (ref 0.20–1.20)
BUN: 10 mg/dL (ref 7.0–26.0)
CALCIUM: 8.8 mg/dL (ref 8.4–10.4)
CHLORIDE: 114 meq/L — AB (ref 98–109)
CO2: 29 meq/L (ref 22–29)
Creatinine: 0.7 mg/dL (ref 0.6–1.1)
Glucose: 76 mg/dl (ref 70–140)
Potassium: 4.4 mEq/L (ref 3.5–5.1)
Sodium: 146 mEq/L — ABNORMAL HIGH (ref 136–145)
TOTAL PROTEIN: 5.7 g/dL — AB (ref 6.4–8.3)

## 2015-04-29 LAB — LACTATE DEHYDROGENASE: LDH: 140 U/L (ref 94–250)

## 2015-04-29 NOTE — Progress Notes (Signed)
Hematology and Oncology Follow Up Visit  ROMELLE MULDOON [id number] Jun 10, 1956 59 y.o. 04/29/2015   Principle Diagnosis:  1. Gastrointestinal stromal tumors  2. Neurofibromatosis  Current Therapy:   Gleevec 400 mg po q daily    Interim History:  Ms. Oelkers is here today with her daughter for a follow-up. She is doing well and has no complaints at this time.  Her CT scan in June showed no evidence of metastatic disease or GIST recurrence. She did have enlargement  Of a lesion, now 5.3 x 3.7 cm, that appeared to be a benign lateral meningocele at T5-T6. We will repeat her scan in December to reassess.  She has had no abdominal or chest pain. No n/v, cough, rash, dizziness, SOB, palpitations, changes in bowel or bladder habits. No blood in urine or stool.  She has some swelling around her ankles that comes and goes throughout the day. This is not a new issues for her and is unchanged. No tenderness, numbness or tingling in her extremities.  She does have ankle cramping sometimes at night. She has Norflex but has not tried this. She plans to try it the next time this happens.  She has a good appetite and is eating well. She is staying hydrated. Her weight is stable.  She is back to work and enjoying being out of the house.   Medications:    Medication List       This list is accurate as of: 04/29/15 10:14 AM.  Always use your most recent med list.               acetaminophen 500 MG tablet  Commonly known as:  TYLENOL  Take 1,000 mg by mouth every 6 (six) hours as needed. For pain     alendronate 40 MG tablet  Commonly known as:  FOSAMAX  Take 40 mg by mouth every 7 (seven) days. Take with a full glass of water on an empty stomach. NOT SURE OF CORRECT STRENGTH 11-11-14     atorvastatin 80 MG tablet  Commonly known as:  LIPITOR  Take 40 mg by mouth every evening.     CALCIUM-VITAMIN D PO  Take 1 tablet by mouth 2 (two) times daily.     ibuprofen 200 MG tablet  Commonly  known as:  ADVIL,MOTRIN  Take 400 mg by mouth every 6 (six) hours as needed for moderate pain.     imatinib 400 MG tablet  Commonly known as:  GLEEVEC  Take 1 tablet (400 mg total) by mouth daily. Take with meals and large glass of water.Caution:Chemotherapy.     Melatonin 3 MG Tbdp  Take by mouth as needed.     metoCLOPramide 5 MG tablet  Commonly known as:  REGLAN  Take 1 tablet (5 mg total) by mouth every 6 (six) hours as needed for nausea or vomiting.     MOBIC 15 MG tablet  Generic drug:  meloxicam  Take 15 mg by mouth as needed for pain.     orphenadrine 100 MG tablet  Commonly known as:  NORFLEX  Please take 1 tablet, if needed, twice a day for muscle spasms.     oxyCODONE-acetaminophen 5-325 MG per tablet  Commonly known as:  PERCOCET/ROXICET  Take 1 tablet by mouth every 4 (four) hours as needed for moderate pain.     pantoprazole 40 MG tablet  Commonly known as:  PROTONIX  Take 1 tablet (40 mg total) by mouth every morning.  REFRESH OP  Place 1 drop into both eyes as needed (dry eyes).        Allergies: No Known Allergies  Past Medical History, Surgical history, Social history, and Family History were reviewed and updated.  Review of Systems: All other 10 point review of systems is negative.   Physical Exam:  vitals were not taken for this visit.  Wt Readings from Last 3 Encounters:  01/27/15 118 lb (53.524 kg)  11/11/14 119 lb (53.978 kg)  07/20/14 124 lb (56.246 kg)    Ocular: Sclerae unicteric, pupils equal, round and reactive to light Ear-nose-throat: Oropharynx clear, dentition fair Lymphatic: No cervical or supraclavicular adenopathy Lungs no rales or rhonchi, good excursion bilaterally Heart regular rate and rhythm, no murmur appreciated Abd soft, nontender, positive bowel sounds MSK no focal spinal tenderness, no joint edema Neuro: non-focal, well-oriented, appropriate affect Breasts: Deferred  Lab Results  Component Value Date    WBC 6.8 04/29/2015   HGB 11.5* 04/29/2015   HCT 35.9 04/29/2015   MCV 98 04/29/2015   PLT 176 04/29/2015   No results found for: FERRITIN, IRON, TIBC, UIBC, IRONPCTSAT Lab Results  Component Value Date   RBC 3.67* 04/29/2015   No results found for: KPAFRELGTCHN, LAMBDASER, KAPLAMBRATIO No results found for: IGGSERUM, IGA, IGMSERUM No results found for: Odetta Pink, SPEI   Chemistry      Component Value Date/Time   NA 142 01/27/2015 1011   NA 147* 11/11/2014 1022   K 4.7 01/27/2015 1011   K 3.7 11/11/2014 1022   CL 106 01/27/2015 1011   CO2 29 01/27/2015 1011   CO2 26 11/11/2014 1022   BUN 11 01/27/2015 1011   BUN 7.3 11/11/2014 1022   CREATININE 0.74 01/27/2015 1011   CREATININE 0.7 11/11/2014 1022      Component Value Date/Time   CALCIUM 9.5 01/27/2015 1011   CALCIUM 8.6 11/11/2014 1022   ALKPHOS 62 01/27/2015 1011   ALKPHOS 75 11/11/2014 1022   AST 15 01/27/2015 1011   AST 22 11/11/2014 1022   ALT 13 01/27/2015 1011   ALT 24 11/11/2014 1022   BILITOT 0.5 01/27/2015 1011   BILITOT 0.57 11/11/2014 1022     Impression and Plan: Ms. Poplar is 59 year old female with a GIST that is metastatic. She had the tumor resected in June 2015. So far she has had no problems. She is feeling good and is back to work.  She has done well on [place] and will continue at her some dose.  We will plan to repeat her CT scan in December and see her for follow-up and labs that same day.   She knows to contact us with any questions or concerns. We can certainly see her sooner if need be.   Eliezer Bottom, NP 9/15/201610:14 AM

## 2015-05-03 ENCOUNTER — Telehealth: Payer: Self-pay | Admitting: Hematology & Oncology

## 2015-05-03 NOTE — Telephone Encounter (Signed)
Called patient's home #. L/m stating patient's appts and CT Scan appts for 08/03/2015.       [facility].

## 2015-07-16 ENCOUNTER — Other Ambulatory Visit (HOSPITAL_COMMUNITY): Payer: Self-pay | Admitting: Family Medicine

## 2015-07-16 DIAGNOSIS — Z1231 Encounter for screening mammogram for malignant neoplasm of breast: Secondary | ICD-10-CM

## 2015-07-22 ENCOUNTER — Ambulatory Visit (HOSPITAL_COMMUNITY)
Admission: RE | Admit: 2015-07-22 | Discharge: 2015-07-22 | Disposition: A | Discharge status: Home / Self Care | Payer: BLUE CROSS/BLUE SHIELD | Source: Ambulatory Visit | Attending: Family Medicine | Admitting: Family Medicine

## 2015-07-22 DIAGNOSIS — Z1231 Encounter for screening mammogram for malignant neoplasm of breast: Secondary | ICD-10-CM | POA: Diagnosis not present

## 2015-08-03 ENCOUNTER — Ambulatory Visit (HOSPITAL_BASED_OUTPATIENT_CLINIC_OR_DEPARTMENT_OTHER)
Admission: RE | Admit: 2015-08-03 | Discharge: 2015-08-03 | Disposition: A | Discharge status: Home / Self Care | Payer: BLUE CROSS/BLUE SHIELD | Source: Ambulatory Visit | Attending: Family | Admitting: Family

## 2015-08-03 ENCOUNTER — Other Ambulatory Visit (HOSPITAL_BASED_OUTPATIENT_CLINIC_OR_DEPARTMENT_OTHER): Payer: BLUE CROSS/BLUE SHIELD

## 2015-08-03 ENCOUNTER — Encounter (HOSPITAL_BASED_OUTPATIENT_CLINIC_OR_DEPARTMENT_OTHER): Payer: Self-pay

## 2015-08-03 ENCOUNTER — Ambulatory Visit (HOSPITAL_BASED_OUTPATIENT_CLINIC_OR_DEPARTMENT_OTHER): Payer: BLUE CROSS/BLUE SHIELD | Admitting: Hematology & Oncology

## 2015-08-03 ENCOUNTER — Encounter: Payer: Self-pay | Admitting: Hematology & Oncology

## 2015-08-03 VITALS — BP 149/60 | HR 65 | Temp 97.3°F | Resp 16 | Ht 59.0 in | Wt 116.0 lb

## 2015-08-03 DIAGNOSIS — D3502 Benign neoplasm of left adrenal gland: Secondary | ICD-10-CM | POA: Insufficient documentation

## 2015-08-03 DIAGNOSIS — C49A0 Gastrointestinal stromal tumor, unspecified site: Secondary | ICD-10-CM | POA: Diagnosis not present

## 2015-08-03 DIAGNOSIS — C49A3 Gastrointestinal stromal tumor of small intestine: Secondary | ICD-10-CM

## 2015-08-03 DIAGNOSIS — E042 Nontoxic multinodular goiter: Secondary | ICD-10-CM | POA: Insufficient documentation

## 2015-08-03 DIAGNOSIS — R938 Abnormal findings on diagnostic imaging of other specified body structures: Secondary | ICD-10-CM | POA: Insufficient documentation

## 2015-08-03 DIAGNOSIS — M899 Disorder of bone, unspecified: Secondary | ICD-10-CM | POA: Insufficient documentation

## 2015-08-03 DIAGNOSIS — Q8500 Neurofibromatosis, unspecified: Secondary | ICD-10-CM | POA: Insufficient documentation

## 2015-08-03 LAB — CMP (CANCER CENTER ONLY)
ALT(SGPT): 19 U/L (ref 10–47)
AST: 22 U/L (ref 11–38)
Albumin: 3.6 g/dL (ref 3.3–5.5)
Alkaline Phosphatase: 48 U/L (ref 26–84)
BILIRUBIN TOTAL: 0.6 mg/dL (ref 0.20–1.60)
BUN: 11 mg/dL (ref 7–22)
CHLORIDE: 107 meq/L (ref 98–108)
CO2: 28 meq/L (ref 18–33)
CREATININE: 0.7 mg/dL (ref 0.6–1.2)
Calcium: 8.6 mg/dL (ref 8.0–10.3)
Glucose, Bld: 78 mg/dL (ref 73–118)
Potassium: 4.2 mEq/L (ref 3.3–4.7)
SODIUM: 144 meq/L (ref 128–145)
TOTAL PROTEIN: 6.3 g/dL — AB (ref 6.4–8.1)

## 2015-08-03 LAB — CBC WITH DIFFERENTIAL (CANCER CENTER ONLY)
BASO#: 0 10*3/uL (ref 0.0–0.2)
BASO%: 0.7 % (ref 0.0–2.0)
EOS%: 7.5 % — ABNORMAL HIGH (ref 0.0–7.0)
Eosinophils Absolute: 0.5 10*3/uL (ref 0.0–0.5)
HCT: 37.4 % (ref 34.8–46.6)
HGB: 11.9 g/dL (ref 11.6–15.9)
LYMPH#: 1.2 10*3/uL (ref 0.9–3.3)
LYMPH%: 20.5 % (ref 14.0–48.0)
MCH: 31 pg (ref 26.0–34.0)
MCHC: 31.8 g/dL — ABNORMAL LOW (ref 32.0–36.0)
MCV: 97 fL (ref 81–101)
MONO#: 0.4 10*3/uL (ref 0.1–0.9)
MONO%: 6.6 % (ref 0.0–13.0)
NEUT#: 3.9 10*3/uL (ref 1.5–6.5)
NEUT%: 64.7 % (ref 39.6–80.0)
PLATELETS: 194 10*3/uL (ref 145–400)
RBC: 3.84 10*6/uL (ref 3.70–5.32)
RDW: 13.5 % (ref 11.1–15.7)
WBC: 6 10*3/uL (ref 3.9–10.0)

## 2015-08-03 LAB — LACTATE DEHYDROGENASE: LDH: 188 U/L (ref 125–245)

## 2015-08-03 MED ORDER — IOHEXOL 300 MG/ML  SOLN
100.0000 mL | Freq: Once | INTRAMUSCULAR | Status: AC | PRN
  Administered 2015-08-03: 100 mL via INTRAVENOUS

## 2015-08-03 NOTE — Progress Notes (Signed)
Hematology and Oncology Follow Up Visit  GIANNINA SARTEN [id number] 1955/10/05 59 y.o. 08/03/2015   Principle Diagnosis:  1.  Gastrointestinal stromal tumors  2. Neurofibromatosis  Current Therapy:   Gleevec 400 mg po q daily     Interim History:  Ms.  Vanschoyck is back for followup. We last saw her back in September. She is still doing quite well.   She is still working. 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   She is looking for to Christmas. She will be cooking a lot for the Christmas weekend.   We did go ahead and repeat a CT scan. This was done today. The CT scan did not show any evidence of progressive or recurrent disease. I'm just very impressed as how well she is done.   She has had no problems with the [place]. She has had no cough or shortness of breath. She has had no diarrhea. She has had no leg swelling.  Her appetite has been good. There is no nausea or vomiting. She has had no abdominal pain.  She does have the neurofibromatosis. However, this is not been an issue for her.  Overall, her performance status is ECOG 1.   Medications:  Current outpatient prescriptions:  .  acetaminophen (TYLENOL) 500 MG tablet, Take 1,000 mg by mouth every 6 (six) hours as needed. For pain, Disp: , Rfl:  .  alendronate (FOSAMAX) 40 MG tablet, Take 40 mg by mouth every 7 (seven) days. Take with a full glass of water on an empty stomach. NOT SURE OF CORRECT STRENGTH 11-11-14, Disp: , Rfl:  .  atorvastatin (LIPITOR) 80 MG tablet, Take 40 mg by mouth every evening. , Disp: , Rfl:  .  CALCIUM-VITAMIN D PO, Take 1 tablet by mouth 2 (two) times daily. , Disp: , Rfl:  .  FLUVIRIN SUSP, , Disp: , Rfl:  .  ibuprofen (ADVIL,MOTRIN) 200 MG tablet, Take 400 mg by mouth every 6 (six) hours as needed for moderate pain., Disp: , Rfl:  .  imatinib (GLEEVEC) 400 MG tablet, Take 1 tablet (400 mg total) by mouth daily. Take with meals and large glass of  water.Caution:Chemotherapy., Disp: 30 tablet, Rfl: 12 .  Melatonin 3 MG TBDP, Take by mouth as needed., Disp: , Rfl:  .  meloxicam (MOBIC) 15 MG tablet, Take 15 mg by mouth as needed for pain., Disp: , Rfl:  .  metoCLOPramide (REGLAN) 5 MG tablet, Take 1 tablet (5 mg total) by mouth every 6 (six) hours as needed for nausea or vomiting., Disp: 30 tablet, Rfl: 0 .  orphenadrine (NORFLEX) 100 MG tablet, Please take 1 tablet, if needed, twice a day for muscle spasms., Disp: 60 tablet, Rfl: 0 .  pantoprazole (PROTONIX) 40 MG tablet, Take 1 tablet (40 mg total) by mouth every morning., Disp: 90 tablet, Rfl: 3 .  Polyvinyl Alcohol-Povidone (REFRESH OP), Place 1 drop into both eyes as needed (dry eyes). , Disp: , Rfl:   Allergies: No Known Allergies  Past Medical History, Surgical history, Social history, and Family History were reviewed and updated.  Review of Systems: As above  Physical Exam:  height is 4\' 11"  (1.499 m) and weight is 116 lb (52.617 kg). Her oral temperature is 97.3 F (36.3 C). Her blood pressure is 149/60 and her pulse is 65. Her respiration is 16.   [name] in no obvious distress. Head and neck exam shows no ocular or oral lesions.  Again, she has her left eye enucleated. Right eye has good extraocular muscle movement. There is no adenopathy in the neck. Lungs are clear. Cardiac exam regular rate and rhythm with no murmurs, rubs or bruits.. Abdomen is soft. She has good bowel sounds. There is no fluid wave. There is no palpable liver or spleen tip. She is well-healed laparotomy scar. Back exam shows no tenderness over the spine, ribs or hips. Extremities shows no clubbing, cyanosis or edema. His good range most of her joints. She has good strength in her extremities. Skin exam shows the multiple neurofibromas. No rashes are noted. Neurological exam is nonfocal.  Lab Results  Component Value Date   WBC 6.0 08/03/2015   HGB 11.9 08/03/2015   HCT 37.4 08/03/2015   MCV  97 08/03/2015   PLT 194 08/03/2015     Chemistry      Component Value Date/Time   NA 144 08/03/2015 0955   NA 146* 04/29/2015 1006   NA 142 01/27/2015 1011   K 4.2 08/03/2015 0955   K 4.4 04/29/2015 1006   K 4.7 01/27/2015 1011   CL 107 08/03/2015 0955   CL 106 01/27/2015 1011   CO2 28 08/03/2015 0955   CO2 29 04/29/2015 1006   CO2 29 01/27/2015 1011   BUN 11 08/03/2015 0955   BUN 10.0 04/29/2015 1006   BUN 11 01/27/2015 1011   CREATININE 0.7 08/03/2015 0955   CREATININE 0.7 04/29/2015 1006   CREATININE 0.74 01/27/2015 1011      Component Value Date/Time   CALCIUM 8.6 08/03/2015 0955   CALCIUM 8.8 04/29/2015 1006   CALCIUM 9.5 01/27/2015 1011   ALKPHOS 48 08/03/2015 0955   ALKPHOS 52 04/29/2015 1006   ALKPHOS 62 01/27/2015 1011   AST 22 08/03/2015 0955   AST 15 04/29/2015 1006   AST 15 01/27/2015 1011   ALT 19 08/03/2015 0955   ALT 13 04/29/2015 1006   ALT 13 01/27/2015 1011   BILITOT 0.60 08/03/2015 0955   BILITOT 0.36 04/29/2015 1006   BILITOT 0.5 01/27/2015 1011         Impression and Plan: Ms. Coulson is 59 year old female with a GIST. Marland Kitchen This is metastatic. She did have resection of  tumor that was symptomatic. Her surgery was back in June 2015. She still had obvious disease that was not resected. However, what was left behind was not causing her any problems. We certainly cannot see anything on the recent scan.  She is on Gleevec. I will maintain Gleevec.  I do not think that we need any scans for 4 months.  I will plan to see her back in 4 months.      Volanda Napoleon, MD 12/20/20161:08 PM

## 2015-11-09 ENCOUNTER — Encounter: Payer: Self-pay | Admitting: Internal Medicine

## 2015-11-10 ENCOUNTER — Other Ambulatory Visit: Payer: Self-pay | Admitting: *Deleted

## 2015-11-10 DIAGNOSIS — C49A3 Gastrointestinal stromal tumor of small intestine: Secondary | ICD-10-CM

## 2015-11-10 MED ORDER — IMATINIB MESYLATE 400 MG PO TABS: 400.0000 mg | ORAL_TABLET | Freq: Every day | ORAL | Status: DC

## 2015-11-18 ENCOUNTER — Other Ambulatory Visit: Payer: Self-pay | Admitting: Gastroenterology

## 2015-12-21 ENCOUNTER — Ambulatory Visit: Payer: BLUE CROSS/BLUE SHIELD | Admitting: Nurse Practitioner

## 2015-12-22 ENCOUNTER — Encounter: Payer: Self-pay | Admitting: Nurse Practitioner

## 2015-12-22 ENCOUNTER — Ambulatory Visit (INDEPENDENT_AMBULATORY_CARE_PROVIDER_SITE_OTHER): Payer: BLUE CROSS/BLUE SHIELD | Admitting: Nurse Practitioner

## 2015-12-22 ENCOUNTER — Ambulatory Visit: Payer: BLUE CROSS/BLUE SHIELD | Admitting: Nurse Practitioner

## 2015-12-22 VITALS — BP 126/77 | HR 78 | Temp 97.9°F | Ht 59.0 in | Wt 119.2 lb

## 2015-12-22 DIAGNOSIS — C49A3 Gastrointestinal stromal tumor of small intestine: Secondary | ICD-10-CM
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
## 2015-12-22 NOTE — Progress Notes (Signed)
cc'ed to pcp °

## 2015-12-22 NOTE — Patient Instructions (Signed)
1. We will check on the recommendations as far as whether we need to perform another endoscopy. 2. We will call you with our recommendations.

## 2015-12-22 NOTE — Assessment & Plan Note (Addendum)
History of malignant GIST last endoscopy in December 2015. No definitive post-procedure recommendation although she was on the recal list for repeat EGD with letter stating need for OV to determine if EGD is needed. She is being actively followed by oncology who is doing follow-up computed tomography scans which so far showed no recurrent GIST or malignancies. There is a question of need for possible repeat endoscopy. No research or guideline recommendations for surveillance endoscopies could be found. We'll discuss with Dr. Gala Romney on the need for surveillance exam at this time and notify the patient the phone.

## 2015-12-28 ENCOUNTER — Encounter: Payer: Self-pay | Admitting: Family

## 2015-12-28 ENCOUNTER — Ambulatory Visit (HOSPITAL_BASED_OUTPATIENT_CLINIC_OR_DEPARTMENT_OTHER)
Admission: RE | Admit: 2015-12-28 | Discharge: 2015-12-28 | Disposition: A | Discharge status: Home / Self Care | Payer: BLUE CROSS/BLUE SHIELD | Source: Ambulatory Visit | Attending: Hematology & Oncology | Admitting: Hematology & Oncology

## 2015-12-28 ENCOUNTER — Encounter (HOSPITAL_BASED_OUTPATIENT_CLINIC_OR_DEPARTMENT_OTHER): Payer: Self-pay

## 2015-12-28 ENCOUNTER — Other Ambulatory Visit (HOSPITAL_BASED_OUTPATIENT_CLINIC_OR_DEPARTMENT_OTHER): Payer: BLUE CROSS/BLUE SHIELD

## 2015-12-28 ENCOUNTER — Ambulatory Visit (HOSPITAL_BASED_OUTPATIENT_CLINIC_OR_DEPARTMENT_OTHER): Payer: BLUE CROSS/BLUE SHIELD | Admitting: Family

## 2015-12-28 VITALS — BP 126/66 | HR 75 | Temp 97.6°F | Resp 16 | Ht <= 58 in | Wt 117.0 lb

## 2015-12-28 DIAGNOSIS — C49A3 Gastrointestinal stromal tumor of small intestine: Secondary | ICD-10-CM

## 2015-12-28 DIAGNOSIS — Z85038 Personal history of other malignant neoplasm of large intestine: Secondary | ICD-10-CM

## 2015-12-28 LAB — CBC WITH DIFFERENTIAL (CANCER CENTER ONLY)
BASO#: 0 10*3/uL (ref 0.0–0.2)
BASO%: 0.5 % (ref 0.0–2.0)
EOS ABS: 0.3 10*3/uL (ref 0.0–0.5)
EOS%: 4.7 % (ref 0.0–7.0)
HCT: 38 % (ref 34.8–46.6)
HGB: 12.6 g/dL (ref 11.6–15.9)
LYMPH#: 1 10*3/uL (ref 0.9–3.3)
LYMPH%: 17.2 % (ref 14.0–48.0)
MCH: 32.1 pg (ref 26.0–34.0)
MCHC: 33.2 g/dL (ref 32.0–36.0)
MCV: 97 fL (ref 81–101)
MONO#: 0.4 10*3/uL (ref 0.1–0.9)
MONO%: 7.1 % (ref 0.0–13.0)
NEUT#: 4.1 10*3/uL (ref 1.5–6.5)
NEUT%: 70.5 % (ref 39.6–80.0)
PLATELETS: 191 10*3/uL (ref 145–400)
RBC: 3.93 10*6/uL (ref 3.70–5.32)
RDW: 13.4 % (ref 11.1–15.7)
WBC: 5.8 10*3/uL (ref 3.9–10.0)

## 2015-12-28 LAB — COMPREHENSIVE METABOLIC PANEL
ALT: 14 U/L (ref 0–55)
ANION GAP: 5 meq/L (ref 3–11)
AST: 14 U/L (ref 5–34)
Albumin: 3.9 g/dL (ref 3.5–5.0)
Alkaline Phosphatase: 53 U/L (ref 40–150)
BUN: 12.7 mg/dL (ref 7.0–26.0)
CALCIUM: 9 mg/dL (ref 8.4–10.4)
CHLORIDE: 109 meq/L (ref 98–109)
CO2: 27 meq/L (ref 22–29)
CREATININE: 0.7 mg/dL (ref 0.6–1.1)
EGFR: 89 mL/min/{1.73_m2} — AB (ref >90)
Glucose: 87 mg/dl (ref 70–140)
Potassium: 4.2 mEq/L (ref 3.5–5.1)
Sodium: 141 mEq/L (ref 136–145)
Total Bilirubin: 0.33 mg/dL (ref 0.20–1.20)
Total Protein: 6.3 g/dL — ABNORMAL LOW (ref 6.4–8.3)

## 2015-12-28 LAB — LACTATE DEHYDROGENASE: LDH: 162 U/L (ref 125–245)

## 2015-12-28 MED ORDER — IOPAMIDOL (ISOVUE-300) INJECTION 61%
100.0000 mL | Freq: Once | INTRAVENOUS | Status: AC | PRN
  Administered 2015-12-28: 100 mL via INTRAVENOUS

## 2015-12-28 NOTE — Progress Notes (Signed)
Hematology and Oncology Follow Up Visit  TASHUNDA LINEBARGER [id number] [birth year] 60 y.o. 12/28/2015   Principle Diagnosis:  1.Gastrointestinal stromal tumors  2. Neurofibromatosis  Current Therapy:   Gleevec 400 mg po q daily    Interim History:  Ms. Murgo is here today for a follow-up. She is doing well and has no complaints at this time.  Her CT scan today showed no evidence of metastatic disease or GIST recurrence.  No fever, chills, n/v, cough, rash, dizziness, SOB, chest pain, palpitations, abdominal pain or changes in bowel or bladder habits. No tenderness, numbness or tingling in her extremities. The "puffiness" in her ankles comes and goes and is unchanged.  She has maintained a good appetite and is staying well hydrated. Her weight is stable.   Medications:    Medication List       This list is accurate as of: 12/28/15 10:17 AM.  Always use your most recent med list.               acetaminophen 500 MG tablet  Commonly known as:  TYLENOL  Take 1,000 mg by mouth every 6 (six) hours as needed. For pain     alendronate 40 MG tablet  Commonly known as:  FOSAMAX  Take 40 mg by mouth every 7 (seven) days. Take with a full glass of water on an empty stomach. NOT SURE OF CORRECT STRENGTH 11-11-14     atorvastatin 80 MG tablet  Commonly known as:  LIPITOR  Take 40 mg by mouth every evening.     CALCIUM-VITAMIN D PO  Take 1 tablet by mouth 2 (two) times daily.     ibuprofen 200 MG tablet  Commonly known as:  ADVIL,MOTRIN  Take 400 mg by mouth every 6 (six) hours as needed for moderate pain.     imatinib 400 MG tablet  Commonly known as:  GLEEVEC  Take 1 tablet (400 mg total) by mouth daily. Take with meals and large glass of water.Caution:Chemotherapy.     Melatonin 3 MG Tbdp  Take by mouth as needed.     MOBIC 15 MG tablet  Generic drug:  meloxicam  Take 15 mg by mouth as needed for pain.     orphenadrine 100 MG tablet  Commonly known as:  NORFLEX    Please take 1 tablet, if needed, twice a day for muscle spasms.     REFRESH OP  Place 1 drop into both eyes as needed (dry eyes).        Allergies: No Known Allergies  Past Medical History, Surgical history, Social history, and Family History were reviewed and updated.  Review of Systems: All other 10 point review of systems is negative.   Physical Exam:  height is 4\' 10"  (1.473 m) and weight is 117 lb (53.071 kg). Her oral temperature is 97.6 F (36.4 C). Her blood pressure is 126/66 and her pulse is 75. Her respiration is 16.   Wt Readings from Last 3 Encounters:  12/28/15 117 lb (53.071 kg)  12/22/15 119 lb 3.2 oz (54.069 kg)  08/03/15 116 lb (52.617 kg)    Ocular: Sclerae unicteric, pupils equal, round and reactive to light Ear-nose-throat: Oropharynx clear, dentition fair Lymphatic: No cervical supraclavicular or axillary adenopathy Lungs no rales or rhonchi, good excursion bilaterally Heart regular rate and rhythm, no murmur appreciated Abd soft, nontender, positive bowel sounds, no liver or spleen tip palpated on exam, no fluid wave MSK no focal spinal tenderness, no joint edema Neuro: non-focal,  well-oriented, appropriate affect Breasts: Deferred  Lab Results  Component Value Date   WBC 5.8 12/28/2015   HGB 12.6 12/28/2015   HCT 38.0 12/28/2015   MCV 97 12/28/2015   PLT 191 12/28/2015   No results found for: FERRITIN, IRON, TIBC, UIBC, IRONPCTSAT Lab Results  Component Value Date   RBC 3.93 12/28/2015   No results found for: KPAFRELGTCHN, LAMBDASER, KAPLAMBRATIO No results found for: IGGSERUM, IGA, IGMSERUM No results found for: Odetta Pink, SPEI   Chemistry      Component Value Date/Time   NA 144 08/03/2015 0955   NA 146* 04/29/2015 1006   NA 142 01/27/2015 1011   K 4.2 08/03/2015 0955   K 4.4 04/29/2015 1006   K 4.7 01/27/2015 1011   CL 107 08/03/2015 0955   CL 106 01/27/2015 1011   CO2 28  08/03/2015 0955   CO2 29 04/29/2015 1006   CO2 29 01/27/2015 1011   BUN 11 08/03/2015 0955   BUN 10.0 04/29/2015 1006   BUN 11 01/27/2015 1011   CREATININE 0.7 08/03/2015 0955   CREATININE 0.7 04/29/2015 1006   CREATININE 0.74 01/27/2015 1011      Component Value Date/Time   CALCIUM 8.6 08/03/2015 0955   CALCIUM 8.8 04/29/2015 1006   CALCIUM 9.5 01/27/2015 1011   ALKPHOS 48 08/03/2015 0955   ALKPHOS 52 04/29/2015 1006   ALKPHOS 62 01/27/2015 1011   AST 22 08/03/2015 0955   AST 15 04/29/2015 1006   AST 15 01/27/2015 1011   ALT 19 08/03/2015 0955   ALT 13 04/29/2015 1006   ALT 13 01/27/2015 1011   BILITOT 0.60 08/03/2015 0955   BILITOT 0.36 04/29/2015 1006   BILITOT 0.5 01/27/2015 1011     Impression and Plan: Ms. Mehlhaff is 60 year old female with a GIST that is metastatic. She had the tumor resected in June 2015. So far she has had no problems. Her scans today were stable and showed no evidence of recurrent or metastatic disease.  She has done well on [place] and will continue at her some dose.  We will plan to see her back in 6 months for labs, follow-up and repeat scans that same day.  She knows to contact us with any questions or concerns. We can certainly see her sooner if need be.   Eliezer Bottom, NP 5/16/201710:17 AM

## 2016-03-01 DIAGNOSIS — Z1389 Encounter for screening for other disorder: Secondary | ICD-10-CM | POA: Diagnosis not present

## 2016-03-01 DIAGNOSIS — B355 Tinea imbricata: Secondary | ICD-10-CM | POA: Diagnosis not present

## 2016-03-01 DIAGNOSIS — Z6824 Body mass index (BMI) 24.0-24.9, adult: Secondary | ICD-10-CM | POA: Diagnosis not present

## 2016-06-12 ENCOUNTER — Other Ambulatory Visit (HOSPITAL_COMMUNITY): Payer: Self-pay | Admitting: Family Medicine

## 2016-06-12 DIAGNOSIS — Z1231 Encounter for screening mammogram for malignant neoplasm of breast: Secondary | ICD-10-CM

## 2016-06-27 ENCOUNTER — Ambulatory Visit (HOSPITAL_BASED_OUTPATIENT_CLINIC_OR_DEPARTMENT_OTHER): Payer: BLUE CROSS/BLUE SHIELD | Admitting: Family

## 2016-06-27 ENCOUNTER — Other Ambulatory Visit (HOSPITAL_BASED_OUTPATIENT_CLINIC_OR_DEPARTMENT_OTHER): Payer: BLUE CROSS/BLUE SHIELD

## 2016-06-27 ENCOUNTER — Ambulatory Visit (HOSPITAL_BASED_OUTPATIENT_CLINIC_OR_DEPARTMENT_OTHER)
Admission: RE | Admit: 2016-06-27 | Discharge: 2016-06-27 | Disposition: A | Discharge status: Home / Self Care | Payer: BLUE CROSS/BLUE SHIELD | Source: Ambulatory Visit | Attending: Family | Admitting: Family

## 2016-06-27 ENCOUNTER — Encounter (HOSPITAL_BASED_OUTPATIENT_CLINIC_OR_DEPARTMENT_OTHER): Payer: Self-pay

## 2016-06-27 VITALS — BP 129/61 | HR 76 | Temp 97.9°F | Resp 16 | Ht <= 58 in | Wt 123.0 lb

## 2016-06-27 DIAGNOSIS — C49A3 Gastrointestinal stromal tumor of small intestine: Secondary | ICD-10-CM

## 2016-06-27 DIAGNOSIS — D3502 Benign neoplasm of left adrenal gland: Secondary | ICD-10-CM | POA: Diagnosis not present

## 2016-06-27 DIAGNOSIS — Q056 Thoracic spina bifida without hydrocephalus: Secondary | ICD-10-CM | POA: Diagnosis not present

## 2016-06-27 DIAGNOSIS — Z85068 Personal history of other malignant neoplasm of small intestine: Secondary | ICD-10-CM

## 2016-06-27 DIAGNOSIS — C179 Malignant neoplasm of small intestine, unspecified: Secondary | ICD-10-CM | POA: Diagnosis not present

## 2016-06-27 DIAGNOSIS — Q8500 Neurofibromatosis, unspecified: Secondary | ICD-10-CM | POA: Insufficient documentation

## 2016-06-27 LAB — CBC WITH DIFFERENTIAL (CANCER CENTER ONLY)
BASO#: 0.1 10*3/uL (ref 0.0–0.2)
BASO%: 0.8 % (ref 0.0–2.0)
EOS%: 6.1 % (ref 0.0–7.0)
Eosinophils Absolute: 0.4 10*3/uL (ref 0.0–0.5)
HCT: 37.8 % (ref 34.8–46.6)
HGB: 12.5 g/dL (ref 11.6–15.9)
LYMPH#: 1.2 10*3/uL (ref 0.9–3.3)
LYMPH%: 18.8 % (ref 14.0–48.0)
MCH: 32.1 pg (ref 26.0–34.0)
MCHC: 33.1 g/dL (ref 32.0–36.0)
MCV: 97 fL (ref 81–101)
MONO#: 0.4 10*3/uL (ref 0.1–0.9)
MONO%: 6.5 % (ref 0.0–13.0)
NEUT#: 4.4 10*3/uL (ref 1.5–6.5)
NEUT%: 67.8 % (ref 39.6–80.0)
PLATELETS: 207 10*3/uL (ref 145–400)
RBC: 3.9 10*6/uL (ref 3.70–5.32)
RDW: 13 % (ref 11.1–15.7)
WBC: 6.4 10*3/uL (ref 3.9–10.0)

## 2016-06-27 LAB — LACTATE DEHYDROGENASE: LDH: 194 U/L (ref 125–245)

## 2016-06-27 LAB — CMP (CANCER CENTER ONLY)
ALK PHOS: 64 U/L (ref 26–84)
ALT: 22 U/L (ref 10–47)
AST: 26 U/L (ref 11–38)
Albumin: 3.8 g/dL (ref 3.3–5.5)
BUN: 11 mg/dL (ref 7–22)
CALCIUM: 9.9 mg/dL (ref 8.0–10.3)
CO2: 31 mEq/L (ref 18–33)
Chloride: 106 mEq/L (ref 98–108)
Creat: 1.1 mg/dl (ref 0.6–1.2)
Glucose, Bld: 96 mg/dL (ref 73–118)
POTASSIUM: 4.5 meq/L (ref 3.3–4.7)
Sodium: 143 mEq/L (ref 128–145)
TOTAL PROTEIN: 6.6 g/dL (ref 6.4–8.1)
Total Bilirubin: 0.6 mg/dl (ref 0.20–1.60)

## 2016-06-27 MED ORDER — IOPAMIDOL (ISOVUE-300) INJECTION 61%
100.0000 mL | Freq: Once | INTRAVENOUS | Status: AC | PRN
  Administered 2016-06-27: 100 mL via INTRAVENOUS

## 2016-06-27 NOTE — Progress Notes (Signed)
Hematology and Oncology Follow Up Visit  BIRTHA VESS [id number] 02/23/1956 60 y.o. 06/27/2016   Principle Diagnosis:  1.Gastrointestinal stromal tumors  2. Neurofibromatosis  Current Therapy:   Gleevec 400 mg po q daily    Interim History:  Ms. Streetman is here today for a follow-up and scans. Her CT scans showed no evidence of recurrent tumor or metastatic disease. She verbalized that she is taking her Gleevec 400 mg PO daily as prescribed.  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 She enjoys her job.  She has had no issue with infections. No lymphadenopathy found on exam.  No fever, chills, n/v, cough, rash, headaches, vision changes, SOB, chest pain, palpitations, abdominal pain or changes in bowel or bladder habits. She has occasional dizziness due to vertigo.  No tenderness, numbness or tingling in her extremities. The "puffiness" in her ankles still comes and goes. This is unchanged.  She has maintained a good appetite and is staying well hydrated. Her weight is stable.   Medications:    Medication List       Accurate as of 06/27/16  9:55 AM. Always use your most recent med list.          acetaminophen 500 MG tablet Commonly known as:  TYLENOL Take 1,000 mg by mouth every 6 (six) hours as needed. For pain   alendronate 40 MG tablet Commonly known as:  FOSAMAX Take 40 mg by mouth every 7 (seven) days. Take with a full glass of water on an empty stomach. NOT SURE OF CORRECT STRENGTH 11-11-14   atorvastatin 80 MG tablet Commonly known as:  LIPITOR Take 40 mg by mouth every evening.   CALCIUM-VITAMIN D PO Take 1 tablet by mouth 2 (two) times daily.   ibuprofen 200 MG tablet Commonly known as:  ADVIL,MOTRIN Take 400 mg by mouth every 6 (six) hours as needed for moderate pain.   imatinib 400 MG tablet Commonly known as:  GLEEVEC Take 1 tablet (400 mg total) by mouth daily. Take with meals and large glass of  water.Caution:Chemotherapy.   Melatonin 3 MG Tbdp Take by mouth as needed.   MOBIC 15 MG tablet Generic drug:  meloxicam Take 15 mg by mouth as needed for pain.   orphenadrine 100 MG tablet Commonly known as:  NORFLEX Please take 1 tablet, if needed, twice a day for muscle spasms.   pantoprazole 40 MG tablet Commonly known as:  PROTONIX Take 40 mg by mouth 2 (two) times a week.   REFRESH OP Place 1 drop into both eyes as needed (dry eyes).       Allergies: No Known Allergies  Past Medical History, Surgical history, Social history, and Family History were reviewed and updated.  Review of Systems: All other 10 point review of systems is negative.   Physical Exam:  height is 4\' 10"  (1.473 m) and weight is 123 lb (55.8 kg). Her oral temperature is 97.9 F (36.6 C). Her blood pressure is 129/61 and her pulse is 76. Her respiration is 16.   Wt Readings from Last 3 Encounters:  06/27/16 123 lb (55.8 kg)  12/28/15 117 lb (53.1 kg)  12/22/15 119 lb 3.2 oz (54.1 kg)    Ocular: Sclera unicteric, pupil equal, round and reactive to light Ear-nose-throat: Oropharynx clear, dentition fair Lymphatic: No cervical supraclavicular or axillary adenopathy Lungs no rales or rhonchi, good excursion bilaterally Heart regular rate and rhythm, no murmur appreciated Abd soft, nontender, positive bowel sounds,  no liver or spleen tip palpated on exam, no fluid wave MSK no focal spinal tenderness, no joint edema Neuro: non-focal, well-oriented, appropriate affect Breasts: Deferred  Lab Results  Component Value Date   WBC 6.4 06/27/2016   HGB 12.5 06/27/2016   HCT 37.8 06/27/2016   MCV 97 06/27/2016   PLT 207 06/27/2016   No results found for: FERRITIN, IRON, TIBC, UIBC, IRONPCTSAT Lab Results  Component Value Date   RBC 3.90 06/27/2016   No results found for: KPAFRELGTCHN, LAMBDASER, KAPLAMBRATIO No results found for: IGGSERUM, IGA, IGMSERUM No results found for: Odetta Pink, SPEI   Chemistry      Component Value Date/Time   NA 143 06/27/2016 0810   NA 141 12/28/2015 0955   K 4.5 06/27/2016 0810   K 4.2 12/28/2015 0955   CL 106 06/27/2016 0810   CO2 31 06/27/2016 0810   CO2 27 12/28/2015 0955   BUN 11 06/27/2016 0810   BUN 12.7 12/28/2015 0955   CREATININE 1.1 06/27/2016 0810   CREATININE 0.7 12/28/2015 0955      Component Value Date/Time   CALCIUM 9.9 06/27/2016 0810   CALCIUM 9.0 12/28/2015 0955   ALKPHOS 64 06/27/2016 0810   ALKPHOS 53 12/28/2015 0955   AST 26 06/27/2016 0810   AST 14 12/28/2015 0955   ALT 22 06/27/2016 0810   ALT 14 12/28/2015 0955   BILITOT 0.60 06/27/2016 0810   BILITOT 0.33 12/28/2015 0955     Impression and Plan: Ms. Darcey is 60 year old female with a GIST that is metastatic. She had the tumor resected in June 2015. So far she has done well on [place] and there has been no evidence of recurrence. Scans this morning confirmed no recurrent tumor or metastatic disease.  CBC and CMP look good. LDH pending.  She will continue on her same regimen of Gleevec.  We will plan to see her back in 6 months for labs, follow-up and repeat scans that same day.  She knows to contact us with any questions or concerns. We can certainly see her sooner if need be.   Eliezer Bottom, NP 11/14/20179:55 AM

## 2016-07-24 ENCOUNTER — Ambulatory Visit (HOSPITAL_COMMUNITY)
Admission: RE | Admit: 2016-07-24 | Discharge: 2016-07-24 | Disposition: A | Discharge status: Home / Self Care | Payer: BLUE CROSS/BLUE SHIELD | Source: Ambulatory Visit | Attending: Family Medicine | Admitting: Family Medicine

## 2016-07-24 DIAGNOSIS — Z1231 Encounter for screening mammogram for malignant neoplasm of breast: Secondary | ICD-10-CM | POA: Insufficient documentation

## 2016-08-15 ENCOUNTER — Other Ambulatory Visit: Payer: Self-pay | Admitting: Nurse Practitioner

## 2016-10-09 ENCOUNTER — Other Ambulatory Visit: Payer: Self-pay | Admitting: Hematology & Oncology

## 2016-10-09 DIAGNOSIS — C49A3 Gastrointestinal stromal tumor of small intestine: Secondary | ICD-10-CM

## 2016-10-31 DIAGNOSIS — Z01419 Encounter for gynecological examination (general) (routine) without abnormal findings: Secondary | ICD-10-CM | POA: Diagnosis not present

## 2016-10-31 DIAGNOSIS — F419 Anxiety disorder, unspecified: Secondary | ICD-10-CM | POA: Diagnosis not present

## 2016-10-31 DIAGNOSIS — E782 Mixed hyperlipidemia: Secondary | ICD-10-CM | POA: Diagnosis not present

## 2016-10-31 DIAGNOSIS — Z1389 Encounter for screening for other disorder: Secondary | ICD-10-CM | POA: Diagnosis not present

## 2016-10-31 DIAGNOSIS — K219 Gastro-esophageal reflux disease without esophagitis: Secondary | ICD-10-CM | POA: Diagnosis not present

## 2016-10-31 DIAGNOSIS — E663 Overweight: Secondary | ICD-10-CM | POA: Diagnosis not present

## 2016-10-31 DIAGNOSIS — Z6825 Body mass index (BMI) 25.0-25.9, adult: Secondary | ICD-10-CM | POA: Diagnosis not present

## 2016-12-18 ENCOUNTER — Ambulatory Visit (HOSPITAL_BASED_OUTPATIENT_CLINIC_OR_DEPARTMENT_OTHER): Payer: BLUE CROSS/BLUE SHIELD | Admitting: Hematology & Oncology

## 2016-12-18 ENCOUNTER — Ambulatory Visit (HOSPITAL_BASED_OUTPATIENT_CLINIC_OR_DEPARTMENT_OTHER)
Admission: RE | Admit: 2016-12-18 | Discharge: 2016-12-18 | Disposition: A | Discharge status: Home / Self Care | Payer: BLUE CROSS/BLUE SHIELD | Source: Ambulatory Visit | Attending: Family | Admitting: Family

## 2016-12-18 ENCOUNTER — Other Ambulatory Visit (HOSPITAL_BASED_OUTPATIENT_CLINIC_OR_DEPARTMENT_OTHER): Payer: BLUE CROSS/BLUE SHIELD

## 2016-12-18 VITALS — BP 131/62 | HR 70 | Temp 97.9°F | Resp 17 | Wt 124.0 lb

## 2016-12-18 DIAGNOSIS — Z980 Intestinal bypass and anastomosis status: Secondary | ICD-10-CM | POA: Diagnosis not present

## 2016-12-18 DIAGNOSIS — Z8500 Personal history of malignant neoplasm of unspecified digestive organ: Secondary | ICD-10-CM

## 2016-12-18 DIAGNOSIS — C49A3 Gastrointestinal stromal tumor of small intestine: Secondary | ICD-10-CM

## 2016-12-18 DIAGNOSIS — Z8509 Personal history of malignant neoplasm of other digestive organs: Secondary | ICD-10-CM | POA: Diagnosis not present

## 2016-12-18 DIAGNOSIS — I700 Atherosclerosis of aorta: Secondary | ICD-10-CM | POA: Insufficient documentation

## 2016-12-18 LAB — CMP (CANCER CENTER ONLY)
ALT: 20 U/L (ref 10–47)
AST: 23 U/L (ref 11–38)
Albumin: 3.7 g/dL (ref 3.3–5.5)
Alkaline Phosphatase: 56 U/L (ref 26–84)
BILIRUBIN TOTAL: 0.7 mg/dL (ref 0.20–1.60)
BUN: 8 mg/dL (ref 7–22)
CO2: 29 meq/L (ref 18–33)
CREATININE: 0.9 mg/dL (ref 0.6–1.2)
Calcium: 9 mg/dL (ref 8.0–10.3)
Chloride: 106 mEq/L (ref 98–108)
GLUCOSE: 92 mg/dL (ref 73–118)
Potassium: 4.2 mEq/L (ref 3.3–4.7)
Sodium: 144 mEq/L (ref 128–145)
Total Protein: 6.2 g/dL — ABNORMAL LOW (ref 6.4–8.1)

## 2016-12-18 LAB — CBC WITH DIFFERENTIAL (CANCER CENTER ONLY)
BASO#: 0 10*3/uL (ref 0.0–0.2)
BASO%: 0.3 % (ref 0.0–2.0)
EOS%: 6.8 % (ref 0.0–7.0)
Eosinophils Absolute: 0.4 10*3/uL (ref 0.0–0.5)
HEMATOCRIT: 38.8 % (ref 34.8–46.6)
HGB: 12.7 g/dL (ref 11.6–15.9)
LYMPH#: 1.2 10*3/uL (ref 0.9–3.3)
LYMPH%: 20.3 % (ref 14.0–48.0)
MCH: 32 pg (ref 26.0–34.0)
MCHC: 32.7 g/dL (ref 32.0–36.0)
MCV: 98 fL (ref 81–101)
MONO#: 0.4 10*3/uL (ref 0.1–0.9)
MONO%: 6.8 % (ref 0.0–13.0)
NEUT#: 3.8 10*3/uL (ref 1.5–6.5)
NEUT%: 65.8 % (ref 39.6–80.0)
PLATELETS: 203 10*3/uL (ref 145–400)
RBC: 3.97 10*6/uL (ref 3.70–5.32)
RDW: 13.5 % (ref 11.1–15.7)
WBC: 5.9 10*3/uL (ref 3.9–10.0)

## 2016-12-18 LAB — LACTATE DEHYDROGENASE: LDH: 182 U/L (ref 125–245)

## 2016-12-18 MED ORDER — IMATINIB MESYLATE 400 MG PO TABS: 400.0000 mg | ORAL_TABLET | Freq: Every day | ORAL | 3 refills | Status: DC

## 2016-12-18 MED ORDER — IOPAMIDOL (ISOVUE-300) INJECTION 61%
100.0000 mL | Freq: Once | INTRAVENOUS | Status: AC | PRN
  Administered 2016-12-18: 100 mL via INTRAVENOUS

## 2016-12-18 NOTE — Progress Notes (Signed)
Hematology and Oncology Follow Up Visit  Stephanie [name] [id number] [birth year] 61 y.o. 12/18/2016   Principle Diagnosis:  1.  Gastrointestinal stromal tumors  2. Neurofibromatosis  Current Therapy:   Gleevec 400 mg po q daily     Interim History:  [name] is back for followup. She is doing quite well. We last saw her back in November. She got through all the holidays and through wintertime without any problems.  She is still working. She works at a retirement home. She loves doing what she does. She is working full time.  We did do her scans today. The CT scans did not show he had evidence of obvious metastatic disease.  She's had no nausea or vomiting. Stephanie [name] been no problems with bowels or bladder. She's had no cough.  Her neurofibromatosis has not caused any issues for her.  Overall, her performance status is ECOG 1.   Medications:  Current Outpatient Prescriptions:  .  acetaminophen (TYLENOL) 500 MG tablet, Take 1,000 mg by mouth every 6 (six) hours as needed. For pain, Disp: , Rfl:  .  alendronate (FOSAMAX) 40 MG tablet, Take 40 mg by mouth every 7 (seven) days. Take with a full glass of water on an empty stomach. NOT SURE OF CORRECT STRENGTH 11-11-14, Disp: , Rfl:  .  atorvastatin (LIPITOR) 80 MG tablet, Take 40 mg by mouth every evening. , Disp: , Rfl:  .  CALCIUM-VITAMIN D PO, Take 1 tablet by mouth 2 (two) times daily. , Disp: , Rfl:  .  ibuprofen (ADVIL,MOTRIN) 200 MG tablet, Take 400 mg by mouth every 6 (six) hours as needed for moderate pain., Disp: , Rfl:  .  imatinib (GLEEVEC) 400 MG tablet, Take 1 tablet (400 mg total) by mouth daily. Take with meals and large glass of water.Caution:Chemotherapy., Disp: 90 tablet, Rfl: 3 .  Melatonin 3 MG TBDP, Take by mouth as needed., Disp: , Rfl:  .  meloxicam (MOBIC) 15 MG tablet, Take 15 mg by mouth as needed for pain., Disp: , Rfl:  .  orphenadrine (NORFLEX) 100 MG tablet, Please take 1 tablet, if needed, twice a day for  muscle spasms., Disp: 60 tablet, Rfl: 0 .  pantoprazole (PROTONIX) 40 MG tablet, TAKE 1 TABLET EVERY MORNING, Disp: 90 tablet, Rfl: 2 .  Polyvinyl Alcohol-Povidone (REFRESH OP), Place 1 drop into both eyes as needed (dry eyes). , Disp: , Rfl:   Allergies: No Known Allergies  Past Medical History, Surgical history, Social history, and Family History were reviewed and updated.  Review of Systems: As above  Physical Exam:  weight is 124 lb (56.2 kg). Her oral temperature is 97.9 F (36.6 C). Her blood pressure is 131/62 and her pulse is 70. Her respiration is 17 and oxygen saturation is 100%.   [name] in no obvious distress. Head and neck exam shows no ocular or oral lesions. Again, she has her left eye enucleated. Right eye has good extraocular muscle movement. There is no adenopathy in the neck. Lungs are clear. Cardiac exam regular rate and rhythm with no murmurs, rubs or bruits.. Abdomen is soft. She has good bowel sounds. There is no fluid wave. There is no palpable liver or spleen tip. She is well-healed laparotomy scar. Back exam shows no tenderness over the spine, ribs or hips. Extremities shows no clubbing, cyanosis or edema. Stephanie [name] good range most of her joints. She has good strength in her extremities. Skin exam shows the multiple neurofibromas. No rashes are  noted. Neurological exam is nonfocal.  Lab Results  Component Value Date   WBC 5.9 12/18/2016   HGB 12.7 12/18/2016   HCT 38.8 12/18/2016   MCV 98 12/18/2016   PLT 203 12/18/2016     Chemistry      Component Value Date/Time   NA 144 12/18/2016 0811   NA 141 12/28/2015 0955   K 4.2 12/18/2016 0811   K 4.2 12/28/2015 0955   CL 106 12/18/2016 0811   CO2 29 12/18/2016 0811   CO2 27 12/28/2015 0955   BUN 8 12/18/2016 0811   BUN 12.7 12/28/2015 0955   CREATININE 0.9 12/18/2016 0811   CREATININE 0.7 12/28/2015 0955      Component Value Date/Time   CALCIUM 9.0 12/18/2016 0811   CALCIUM 9.0 12/28/2015 0955    ALKPHOS 56 12/18/2016 0811   ALKPHOS 53 12/28/2015 0955   AST 23 12/18/2016 0811   AST 14 12/28/2015 0955   ALT 20 12/18/2016 0811   ALT 14 12/28/2015 0955   BILITOT 0.70 12/18/2016 0811   BILITOT 0.33 12/28/2015 0955         Impression and Plan: Stephanie [name] is 61 year old female with a GIST. Marland Kitchen This is metastatic. She did have resection of  tumor that was symptomatic. Her surgery was back in June 2015.   The CT scan is very reassuring. We don't see anything that looks like there is obvious metastatic disease.  We will move her appointment out to 8 months. This will make it the end of the year.   At some point, we will try to move her follow-up and scans to yearly. I think after 5 years, we can probably hold off on the scans.     Stephanie Napoleon, MD 5/7/201810:12 AM

## 2017-03-26 DIAGNOSIS — R2241 Localized swelling, mass and lump, right lower limb: Secondary | ICD-10-CM | POA: Diagnosis not present

## 2017-03-26 DIAGNOSIS — E010 Iodine-deficiency related diffuse (endemic) goiter: Secondary | ICD-10-CM | POA: Diagnosis not present

## 2017-03-26 DIAGNOSIS — C49A3 Gastrointestinal stromal tumor of small intestine: Secondary | ICD-10-CM | POA: Diagnosis not present

## 2017-03-27 ENCOUNTER — Other Ambulatory Visit: Payer: Self-pay | Admitting: General Surgery

## 2017-03-27 DIAGNOSIS — E010 Iodine-deficiency related diffuse (endemic) goiter: Secondary | ICD-10-CM

## 2017-03-30 ENCOUNTER — Ambulatory Visit
Admission: RE | Admit: 2017-03-30 | Discharge: 2017-03-30 | Disposition: A | Discharge status: Home / Self Care | Payer: BLUE CROSS/BLUE SHIELD | Source: Ambulatory Visit | Attending: General Surgery | Admitting: General Surgery

## 2017-03-30 ENCOUNTER — Other Ambulatory Visit (INDEPENDENT_AMBULATORY_CARE_PROVIDER_SITE_OTHER): Payer: Self-pay | Admitting: Family

## 2017-03-30 DIAGNOSIS — E010 Iodine-deficiency related diffuse (endemic) goiter: Secondary | ICD-10-CM | POA: Diagnosis not present

## 2017-04-02 NOTE — Progress Notes (Signed)
Please let patient know that I am having our thyroid guy look at her ultrasound.  We may end up needing a biopsy.

## 2017-04-03 ENCOUNTER — Telehealth: Payer: Self-pay | Admitting: General Surgery

## 2017-04-03 NOTE — Telephone Encounter (Signed)
err

## 2017-04-05 ENCOUNTER — Other Ambulatory Visit: Payer: Self-pay | Admitting: General Surgery

## 2017-04-05 DIAGNOSIS — E010 Iodine-deficiency related diffuse (endemic) goiter: Secondary | ICD-10-CM

## 2017-04-11 ENCOUNTER — Other Ambulatory Visit (HOSPITAL_COMMUNITY)
Admission: RE | Admit: 2017-04-11 | Discharge: 2017-04-11 | Disposition: A | Discharge status: Home / Self Care | Payer: BLUE CROSS/BLUE SHIELD | Source: Ambulatory Visit | Attending: Radiology | Admitting: Radiology

## 2017-04-11 ENCOUNTER — Ambulatory Visit
Admission: RE | Admit: 2017-04-11 | Discharge: 2017-04-11 | Disposition: A | Discharge status: Home / Self Care | Payer: BLUE CROSS/BLUE SHIELD | Source: Ambulatory Visit | Attending: General Surgery | Admitting: General Surgery

## 2017-04-11 DIAGNOSIS — E041 Nontoxic single thyroid nodule: Secondary | ICD-10-CM | POA: Insufficient documentation

## 2017-04-11 DIAGNOSIS — E010 Iodine-deficiency related diffuse (endemic) goiter: Secondary | ICD-10-CM

## 2017-04-12 DIAGNOSIS — R2243 Localized swelling, mass and lump, lower limb, bilateral: Secondary | ICD-10-CM | POA: Diagnosis not present

## 2017-04-23 DIAGNOSIS — R2243 Localized swelling, mass and lump, lower limb, bilateral: Secondary | ICD-10-CM | POA: Diagnosis not present

## 2017-04-25 ENCOUNTER — Other Ambulatory Visit: Payer: Self-pay | Admitting: Orthopedic Surgery

## 2017-05-09 ENCOUNTER — Encounter (HOSPITAL_BASED_OUTPATIENT_CLINIC_OR_DEPARTMENT_OTHER): Payer: Self-pay | Admitting: *Deleted

## 2017-05-17 ENCOUNTER — Ambulatory Visit (HOSPITAL_BASED_OUTPATIENT_CLINIC_OR_DEPARTMENT_OTHER)
Admission: RE | Admit: 2017-05-17 | Discharge: 2017-05-17 | Disposition: A | Discharge status: Home / Self Care | Payer: BLUE CROSS/BLUE SHIELD | Source: Ambulatory Visit | Attending: Orthopedic Surgery | Admitting: Orthopedic Surgery

## 2017-05-17 ENCOUNTER — Ambulatory Visit (HOSPITAL_BASED_OUTPATIENT_CLINIC_OR_DEPARTMENT_OTHER): Payer: BLUE CROSS/BLUE SHIELD | Admitting: Certified Registered"

## 2017-05-17 ENCOUNTER — Encounter (HOSPITAL_BASED_OUTPATIENT_CLINIC_OR_DEPARTMENT_OTHER)
Admission: RE | Disposition: A | Discharge status: Home / Self Care | Payer: Self-pay | Source: Ambulatory Visit | Attending: Orthopedic Surgery

## 2017-05-17 ENCOUNTER — Encounter (HOSPITAL_BASED_OUTPATIENT_CLINIC_OR_DEPARTMENT_OTHER): Payer: Self-pay | Admitting: Certified Registered"

## 2017-05-17 DIAGNOSIS — D3613 Benign neoplasm of peripheral nerves and autonomic nervous system of lower limb, including hip: Secondary | ICD-10-CM | POA: Diagnosis not present

## 2017-05-17 DIAGNOSIS — H5462 Unqualified visual loss, left eye, normal vision right eye: Secondary | ICD-10-CM | POA: Diagnosis not present

## 2017-05-17 DIAGNOSIS — Z79899 Other long term (current) drug therapy: Secondary | ICD-10-CM | POA: Diagnosis not present

## 2017-05-17 DIAGNOSIS — K219 Gastro-esophageal reflux disease without esophagitis: Secondary | ICD-10-CM | POA: Diagnosis not present

## 2017-05-17 DIAGNOSIS — C49A3 Gastrointestinal stromal tumor of small intestine: Secondary | ICD-10-CM | POA: Diagnosis not present

## 2017-05-17 DIAGNOSIS — R2241 Localized swelling, mass and lump, right lower limb: Secondary | ICD-10-CM | POA: Diagnosis not present

## 2017-05-17 DIAGNOSIS — Z8500 Personal history of malignant neoplasm of unspecified digestive organ: Secondary | ICD-10-CM | POA: Diagnosis not present

## 2017-05-17 DIAGNOSIS — R933 Abnormal findings on diagnostic imaging of other parts of digestive tract: Secondary | ICD-10-CM | POA: Diagnosis not present

## 2017-05-17 DIAGNOSIS — Z9889 Other specified postprocedural states: Secondary | ICD-10-CM

## 2017-05-17 DIAGNOSIS — E7800 Pure hypercholesterolemia, unspecified: Secondary | ICD-10-CM | POA: Insufficient documentation

## 2017-05-17 DIAGNOSIS — D492 Neoplasm of unspecified behavior of bone, soft tissue, and skin: Secondary | ICD-10-CM | POA: Diagnosis not present

## 2017-05-17 HISTORY — PX: MASS EXCISION: SHX2000

## 2017-05-17 SURGERY — EXCISION MASS
Anesthesia: Monitor Anesthesia Care | Site: Foot | Laterality: Right

## 2017-05-17 MED ORDER — MIDAZOLAM HCL 2 MG/2ML IJ SOLN
INTRAMUSCULAR | Status: AC
  Filled 2017-05-17: qty 2

## 2017-05-17 MED ORDER — SENNA 8.6 MG PO TABS: 2.0000 | ORAL_TABLET | Freq: Two times a day (BID) | ORAL | 0 refills | Status: DC

## 2017-05-17 MED ORDER — LIDOCAINE 2% (20 MG/ML) 5 ML SYRINGE
INTRAMUSCULAR | Status: AC
  Filled 2017-05-17: qty 20

## 2017-05-17 MED ORDER — FENTANYL CITRATE (PF) 100 MCG/2ML IJ SOLN
50.0000 ug | INTRAMUSCULAR | Status: DC | PRN
Start: 2017-05-17 — End: 2017-05-17
  Administered 2017-05-17 (×2): 50 ug via INTRAVENOUS

## 2017-05-17 MED ORDER — CEFAZOLIN SODIUM-DEXTROSE 2-4 GM/100ML-% IV SOLN
2.0000 g | INTRAVENOUS | Status: AC
  Administered 2017-05-17: 2 g via INTRAVENOUS

## 2017-05-17 MED ORDER — HYDROCODONE-ACETAMINOPHEN 5-325 MG PO TABS: 1.0000 | ORAL_TABLET | ORAL | 0 refills | Status: DC | PRN

## 2017-05-17 MED ORDER — MIDAZOLAM HCL 2 MG/2ML IJ SOLN
1.0000 mg | INTRAMUSCULAR | Status: DC | PRN
  Administered 2017-05-17 (×2): 1 mg via INTRAVENOUS

## 2017-05-17 MED ORDER — PROPOFOL 500 MG/50ML IV EMUL
INTRAVENOUS | Status: AC
  Filled 2017-05-17: qty 100

## 2017-05-17 MED ORDER — CHLORHEXIDINE GLUCONATE 4 % EX LIQD: 60.0000 mL | Freq: Once | CUTANEOUS | Status: DC

## 2017-05-17 MED ORDER — ONDANSETRON HCL 4 MG/2ML IJ SOLN
INTRAMUSCULAR | Status: DC | PRN
  Administered 2017-05-17: 4 mg via INTRAVENOUS

## 2017-05-17 MED ORDER — FENTANYL CITRATE (PF) 100 MCG/2ML IJ SOLN
INTRAMUSCULAR | Status: AC
  Filled 2017-05-17: qty 2

## 2017-05-17 MED ORDER — SCOPOLAMINE 1 MG/3DAYS TD PT72: 1.0000 | MEDICATED_PATCH | Freq: Once | TRANSDERMAL | Status: DC | PRN

## 2017-05-17 MED ORDER — EPHEDRINE 5 MG/ML INJ
INTRAVENOUS | Status: AC
Start: 2017-05-17 — End: ?
  Filled 2017-05-17: qty 10

## 2017-05-17 MED ORDER — DOCUSATE SODIUM 100 MG PO CAPS: 100.0000 mg | ORAL_CAPSULE | Freq: Two times a day (BID) | ORAL | 0 refills | Status: DC

## 2017-05-17 MED ORDER — PROPOFOL 10 MG/ML IV BOLUS
INTRAVENOUS | Status: DC | PRN
  Administered 2017-05-17: 20 mg via INTRAVENOUS

## 2017-05-17 MED ORDER — LACTATED RINGERS IV SOLN
INTRAVENOUS | Status: DC
  Administered 2017-05-17: 10:00:00 via INTRAVENOUS

## 2017-05-17 MED ORDER — FENTANYL CITRATE (PF) 100 MCG/2ML IJ SOLN: 25.0000 ug | INTRAMUSCULAR | Status: DC | PRN

## 2017-05-17 MED ORDER — ROPIVACAINE HCL 5 MG/ML IJ SOLN
INTRAMUSCULAR | Status: DC | PRN
  Administered 2017-05-17: 25 mL

## 2017-05-17 MED ORDER — CEFAZOLIN SODIUM-DEXTROSE 2-4 GM/100ML-% IV SOLN
INTRAVENOUS | Status: AC
  Filled 2017-05-17: qty 100

## 2017-05-17 MED ORDER — DEXAMETHASONE SODIUM PHOSPHATE 10 MG/ML IJ SOLN
INTRAMUSCULAR | Status: AC
  Filled 2017-05-17: qty 3

## 2017-05-17 MED ORDER — ONDANSETRON HCL 4 MG/2ML IJ SOLN
INTRAMUSCULAR | Status: AC
  Filled 2017-05-17: qty 10

## 2017-05-17 MED ORDER — SODIUM CHLORIDE 0.9 % IV SOLN: INTRAVENOUS | Status: DC

## 2017-05-17 MED ORDER — PROPOFOL 500 MG/50ML IV EMUL
INTRAVENOUS | Status: DC | PRN
  Administered 2017-05-17: 100 ug/kg/min via INTRAVENOUS

## 2017-05-17 MED ORDER — ONDANSETRON HCL 4 MG/2ML IJ SOLN: 4.0000 mg | Freq: Once | INTRAMUSCULAR | Status: DC | PRN

## 2017-05-17 SURGICAL SUPPLY — 65 items
BANDAGE ACE 4X5 VEL STRL LF (GAUZE/BANDAGES/DRESSINGS) IMPLANT
BANDAGE ESMARK 6X9 LF (GAUZE/BANDAGES/DRESSINGS) IMPLANT
BLADE ARTHRO LOK 4 BEAVER (BLADE) IMPLANT
BLADE SURG 15 STRL LF DISP TIS (BLADE) ×1 IMPLANT
BLADE SURG 15 STRL SS (BLADE) ×2
BNDG CMPR 9X4 STRL LF SNTH (GAUZE/BANDAGES/DRESSINGS)
BNDG CMPR 9X6 STRL LF SNTH (GAUZE/BANDAGES/DRESSINGS)
BNDG COHESIVE 4X5 TAN STRL (GAUZE/BANDAGES/DRESSINGS) IMPLANT
BNDG COHESIVE 6X5 TAN STRL LF (GAUZE/BANDAGES/DRESSINGS) IMPLANT
BNDG CONFORM 3 STRL LF (GAUZE/BANDAGES/DRESSINGS) IMPLANT
BNDG ESMARK 4X9 LF (GAUZE/BANDAGES/DRESSINGS) IMPLANT
BNDG ESMARK 6X9 LF (GAUZE/BANDAGES/DRESSINGS)
CHLORAPREP W/TINT 26ML (MISCELLANEOUS) ×2 IMPLANT
CORD BIPOLAR FORCEPS 12FT (ELECTRODE) IMPLANT
COVER BACK TABLE 60X90IN (DRAPES) ×2 IMPLANT
CUFF TOURNIQUET SINGLE 24IN (TOURNIQUET CUFF) IMPLANT
CUFF TOURNIQUET SINGLE 34IN LL (TOURNIQUET CUFF) IMPLANT
DRAPE EXTREMITY T 121X128X90 (DRAPE) ×2 IMPLANT
DRAPE OEC MINIVIEW 54X84 (DRAPES) IMPLANT
DRAPE SURG 17X23 STRL (DRAPES) IMPLANT
DRAPE U-SHAPE 47X51 STRL (DRAPES) IMPLANT
DRSG MEPITEL 4X7.2 (GAUZE/BANDAGES/DRESSINGS) ×2 IMPLANT
DRSG PAD ABDOMINAL 8X10 ST (GAUZE/BANDAGES/DRESSINGS) ×2 IMPLANT
ELECT REM PT RETURN 9FT ADLT (ELECTROSURGICAL) ×2
ELECTRODE REM PT RTRN 9FT ADLT (ELECTROSURGICAL) ×1 IMPLANT
GAUZE SPONGE 4X4 12PLY STRL (GAUZE/BANDAGES/DRESSINGS) ×4 IMPLANT
GLOVE BIO SURGEON STRL SZ8 (GLOVE) ×2 IMPLANT
GLOVE BIOGEL PI IND STRL 8 (GLOVE) ×2 IMPLANT
GLOVE BIOGEL PI INDICATOR 8 (GLOVE) ×2
GLOVE ECLIPSE 8.0 STRL XLNG CF (GLOVE) ×2 IMPLANT
GOWN STRL REUS W/ TWL LRG LVL3 (GOWN DISPOSABLE) ×1 IMPLANT
GOWN STRL REUS W/ TWL XL LVL3 (GOWN DISPOSABLE) ×2 IMPLANT
GOWN STRL REUS W/TWL LRG LVL3 (GOWN DISPOSABLE) ×2
GOWN STRL REUS W/TWL XL LVL3 (GOWN DISPOSABLE) ×4
NDL HYPO 25X1 1.5 SAFETY (NEEDLE) IMPLANT
NEEDLE HYPO 22GX1.5 SAFETY (NEEDLE) IMPLANT
NEEDLE HYPO 25X1 1.5 SAFETY (NEEDLE) IMPLANT
NS IRRIG 1000ML POUR BTL (IV SOLUTION) ×2 IMPLANT
PACK BASIN DAY SURGERY FS (CUSTOM PROCEDURE TRAY) ×2 IMPLANT
PAD CAST 4YDX4 CTTN HI CHSV (CAST SUPPLIES) ×1 IMPLANT
PADDING CAST ABS 4INX4YD NS (CAST SUPPLIES)
PADDING CAST ABS COTTON 4X4 ST (CAST SUPPLIES) IMPLANT
PADDING CAST COTTON 4X4 STRL (CAST SUPPLIES) ×2
PADDING CAST COTTON 6X4 STRL (CAST SUPPLIES) IMPLANT
PENCIL BUTTON HOLSTER BLD 10FT (ELECTRODE) IMPLANT
SANITIZER HAND PURELL 535ML FO (MISCELLANEOUS) ×2 IMPLANT
SHEET MEDIUM DRAPE 40X70 STRL (DRAPES) ×2 IMPLANT
SLEEVE SCD COMPRESS KNEE MED (MISCELLANEOUS) ×2 IMPLANT
SPONGE LAP 18X18 X RAY DECT (DISPOSABLE) ×2 IMPLANT
STOCKINETTE 6  STRL (DRAPES) ×1
STOCKINETTE 6 STRL (DRAPES) ×1 IMPLANT
STRIP CLOSURE SKIN 1/2X4 (GAUZE/BANDAGES/DRESSINGS) IMPLANT
SUCTION FRAZIER HANDLE 10FR (MISCELLANEOUS)
SUCTION TUBE FRAZIER 10FR DISP (MISCELLANEOUS) IMPLANT
SUT ETHILON 3 0 PS 1 (SUTURE) IMPLANT
SUT MNCRL AB 3-0 PS2 18 (SUTURE) IMPLANT
SUT VIC AB 0 SH 27 (SUTURE) IMPLANT
SUT VIC AB 2-0 SH 27 (SUTURE)
SUT VIC AB 2-0 SH 27XBRD (SUTURE) IMPLANT
SYR BULB 3OZ (MISCELLANEOUS) ×2 IMPLANT
SYR CONTROL 10ML LL (SYRINGE) IMPLANT
TOWEL OR 17X24 6PK STRL BLUE (TOWEL DISPOSABLE) ×2 IMPLANT
TUBE CONNECTING 20X1/4 (TUBING) IMPLANT
UNDERPAD 30X30 (UNDERPADS AND DIAPERS) ×2 IMPLANT
YANKAUER SUCT BULB TIP NO VENT (SUCTIONS) IMPLANT

## 2017-05-17 NOTE — Discharge Instructions (Addendum)
Stephanie Simmer, MD [place]  Please read the following information regarding your care after surgery.  Medications  You only need a prescription for the narcotic pain medicine (ex. oxycodone, Percocet, Norco).  All of the other medicines listed below are available over the counter. X Ibuprofen 2 pills three a day for the first 3 days after surgery. X acetominophen (Tylenol) 650 mg every 4-6 hours as you need for minor to moderate pain X hydrocodone as prescribed for severe pain  Narcotic pain medicine (ex. oxycodone, Percocet, Vicodin) will cause constipation.  To prevent this problem, take the following medicines while you are taking any pain medicine. X docusate sodium (Colace) 100 mg twice a day X senna (Senokot) 2 tablets twice a day   Weight Bearing X Bear weight when you are able on your operated leg or foot.  Cast / Splint / Dressing X Remove your dressing 3 days after surgery and cover the incisions with dry dressings.    After your dressing, cast or splint is removed; you may shower, but do not soak or scrub the wound.  Allow the water to run over it, and then gently pat it dry.  Swelling It is normal for you to have swelling where you had surgery.  To reduce swelling and pain, keep your toes above your nose for at least 3 days after surgery.  It may be necessary to keep your foot or leg elevated for several weeks.  If it hurts, it should be elevated.  Follow Up Call my office at [phone] when you are discharged from the hospital or surgery center to schedule an appointment to be seen two weeks after surgery.  Call my office at [phone] if you develop a fever >101.5 F, nausea, vomiting, bleeding from the surgical site or severe pain.      Post Anesthesia Home Care Instructions  Activity: Get plenty of rest for the remainder of the day. A responsible individual must stay with you for 24 hours following the procedure.  For the next 24 hours, DO  NOT: -Drive a car -Paediatric nurse -Drink alcoholic beverages -Take any medication unless instructed by your physician -Make any legal decisions or sign important papers.  Meals: Start with liquid foods such as gelatin or soup. Progress to regular foods as tolerated. Avoid greasy, spicy, heavy foods. If nausea and/or vomiting occur, drink only clear liquids until the nausea and/or vomiting subsides. Call your physician if vomiting continues.  Special Instructions/Symptoms: Your throat may feel dry or sore from the anesthesia or the breathing tube placed in your throat during surgery. If this causes discomfort, gargle with warm salt water. The discomfort should disappear within 24 hours.  If you had a scopolamine patch placed behind your ear for the management of post- operative nausea and/or vomiting:  1. The medication in the patch is effective for 72 hours, after which it should be removed.  Wrap patch in a tissue and discard in the trash. Wash hands thoroughly with soap and water. 2. You may remove the patch earlier than 72 hours if you experience unpleasant side effects which may include dry mouth, dizziness or visual disturbances. 3. Avoid touching the patch. Wash your hands with soap and water after contact with the patch.   Regional Anesthesia Blocks  1. Numbness or the inability to move the "blocked" extremity may last from 3-48 hours after placement. The length of time depends on the medication injected and your individual response to the medication. If the numbness is  not going away after 48 hours, call your surgeon.  2. The extremity that is blocked will need to be protected until the numbness is gone and the  Strength has returned. Because you cannot feel it, you will need to take extra care to avoid injury. Because it may be weak, you may have difficulty moving it or using it. You may not know what position it is in without looking at it while the block is in effect.  3. For  blocks in the legs and feet, returning to weight bearing and walking needs to be done carefully. You will need to wait until the numbness is entirely gone and the strength has returned. You should be able to move your leg and foot normally before you try and bear weight or walk. You will need someone to be with you when you first try to ensure you do not fall and possibly risk injury.  4. Bruising and tenderness at the needle site are common side effects and will resolve in a few days.  5. Persistent numbness or new problems with movement should be communicated to the surgeon or the [facility] [phone] [place] [phone]).  Call your surgeon if you experience:   1.  Fever over 101.0. 2.  Inability to urinate. 3.  Nausea and/or vomiting. 4.  Extreme swelling or bruising at the surgical site. 5.  Continued bleeding from the incision. 6.  Increased pain, redness or drainage from the incision. 7.  Problems related to your pain medication. 8.  Any problems and/or concerns
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
## 2017-05-17 NOTE — Op Note (Signed)
05/17/2017  11:19 AM  PATIENT:  Stephanie [name]  61 y.o. female  PRE-OPERATIVE DIAGNOSIS:  right foot dorsal mass  POST-OPERATIVE DIAGNOSIS:  right foot dorsal mass  Procedure(s):  Excisional biopsy of the right dorsal foot mass 4 mm x 6 mm  SURGEON:  Wylene Simmer, MD  ASSISTANT: Mechele Claude, PA-C  ANESTHESIA:   MAC, regional  EBL:  minimal   TOURNIQUET:   Total Tourniquet Time Documented: Calf (Right) - 13 minutes Total: Calf (Right) - 13 minutes  COMPLICATIONS:  None apparent  DISPOSITION:  Extubated, awake and stable to recovery.  INDICATION FOR PROCEDURE:  The patient is a 61 y/o female with PMh of neurofibromatosis.  She has a painful mass on the dorsum of the foot that she says doesn't feel like her typical neurofibroma masses.  She desires excision of the painful mass since it interferes with her shoewear.  The risks and benefits of the alternative treatment options have been discussed in detail.  The patient wishes to proceed with surgery and specifically understands risks of bleeding, infection, nerve damage, blood clots, need for additional surgery, amputation and death.  PROCEDURE IN DETAIL:  After pre operative consent was obtained, and the correct operative site was identified, the patient was brought to the operating room and placed supine on the OR table.  Anesthesia was administered.  Pre-operative antibiotics were administered.  A surgical timeout was taken.  The right lower extremity was prepped and draped in sterile fashion.  The foot was exsanguinated and a 4" esmarch bandage wrapped around the ankle.  The mass was identified and an elipsoid incision made around it with a margin of approximately 2 mm.  Dissection was carried down to the subcutaneous tissues, and there was no evidence of invasion of any local structures.  The mass was excised and passed off the field to pathology.  The wound was irrigated and closed with 3-0 nylon.  Sterile dressings were applied  followed by a compression wrap.  The patient was awakened from anesthesia and transported to the recovery room in stable condition.  FOLLOW UP PLAN:  RTC in two weeks for wound check, path results and suture removal.  WBAT on R LE.   Mechele Claude PA-C was present and scrubbed for the duration of the operative case. His assistance assistance was essential in positioning the patient, prepping and draping, gaining maintaining exposure, performing the operation, closing and dressing the wounds and applying the splint.

## 2017-05-17 NOTE — Anesthesia Procedure Notes (Signed)
Anesthesia Regional Block: Ankle block   Pre-Anesthetic Checklist: ,, timeout performed, Correct Patient, Correct Site, Correct Laterality, Correct Procedure, Correct Position, site marked, Risks and benefits discussed, pre-op evaluation,  At surgeon's request and post-op pain management  Laterality: Right  Prep: chloraprep       Needles:  Injection technique: Single-shot     Needle Length: 4cm  Needle Gauge: 21     Additional Needles:   Narrative:  Start time: 05/17/2017 10:12 AM End time: 05/17/2017 10:20 AM Injection made incrementally with aspirations every 5 mL. Anesthesiologist: Lyndle Herrlich

## 2017-05-17 NOTE — Anesthesia Preprocedure Evaluation (Signed)
Anesthesia Evaluation  Patient identified by MRN, date of birth, ID band Patient awake    Reviewed: Allergy & Precautions, NPO status , Patient's Chart, lab work & pertinent test results  Airway Mallampati: II  TM Distance: >3 FB Neck ROM: Full    Dental  (+) Teeth Intact, Dental Advisory Given   Pulmonary neg pulmonary ROS,    Pulmonary exam normal breath sounds clear to auscultation       Cardiovascular negative cardio ROS Normal cardiovascular exam Rhythm:Regular Rate:Normal     Neuro/Psych  Headaches, Left eye blindness Neurofibromatosis negative psych ROS   GI/Hepatic Neg liver ROS, GERD  Medicated,  Endo/Other  negative endocrine ROS  Renal/GU negative Renal ROS     Musculoskeletal negative musculoskeletal ROS (+)   Abdominal   Peds  Hematology negative hematology ROS (+)   Anesthesia Other Findings Day of surgery medications reviewed with the patient.  Reproductive/Obstetrics                             Anesthesia Physical Anesthesia Plan  ASA: II  Anesthesia Plan: MAC and Regional   Post-op Pain Management:    Induction: Intravenous  PONV Risk Score and Plan: 2 and Propofol infusion and Midazolam  Airway Management Planned: Simple Face Mask  Additional Equipment:   Intra-op Plan:   Post-operative Plan:   Informed Consent: I have reviewed the patients History and Physical, chart, labs and discussed the procedure including the risks, benefits and alternatives for the proposed anesthesia with the patient or authorized representative who has indicated his/her understanding and acceptance.   Dental advisory given  Plan Discussed with: CRNA and Anesthesiologist  Anesthesia Plan Comments: (Ankle block plus MAC)        Anesthesia Quick Evaluation

## 2017-05-17 NOTE — Progress Notes (Signed)
Assisted Dr. Lyndle Herrlich with right, ultrasound guided, ankle block. Side rails up, monitors on throughout procedure. See vital signs in flow sheet. Tolerated Procedure well.

## 2017-05-17 NOTE — Anesthesia Postprocedure Evaluation (Signed)
Anesthesia Post Note  Patient: Stephanie [name]  Procedure(s) Performed: Excision right foot mass (Right Foot)     Patient location during evaluation: PACU Anesthesia Type: Regional Level of consciousness: awake and alert Pain management: pain level controlled Vital Signs Assessment: post-procedure vital signs reviewed and stable Respiratory status: spontaneous breathing, nonlabored ventilation and respiratory function stable Cardiovascular status: stable and blood pressure returned to baseline Postop Assessment: no apparent nausea or vomiting Anesthetic complications: no    Last Vitals:  Vitals:   05/17/17 1145 05/17/17 1211  BP: 131/64 (!) 150/74  Pulse: 86 87  Resp: 16 18  Temp:  36.7 C  SpO2: 97% 100%    Last Pain:  Vitals:   05/17/17 1211  PainSc: 0-No pain                 Catalina Gravel

## 2017-05-17 NOTE — Transfer of Care (Signed)
Immediate Anesthesia Transfer of Care Note  Patient: Stephanie [name]  Procedure(s) Performed: Excision right foot mass (Right Foot)  Patient Location: PACU  Anesthesia Type:MAC  Level of Consciousness: awake, alert  and oriented  Airway & Oxygen Therapy: Patient Spontanous Breathing and Patient connected to face mask oxygen  Post-op Assessment: Report given to RN  Post vital signs: Reviewed and stable  Last Vitals:  Vitals:   05/17/17 0930  BP: (!) 148/63  Pulse: 78  Resp: 19  Temp: 36.7 C  SpO2: 100%    Last Pain: There were no vitals filed for this visit.       Complications: No apparent anesthesia complications

## 2017-05-18 ENCOUNTER — Encounter (HOSPITAL_BASED_OUTPATIENT_CLINIC_OR_DEPARTMENT_OTHER): Payer: Self-pay | Admitting: Orthopedic Surgery

## 2017-05-30 DIAGNOSIS — Z4789 Encounter for other orthopedic aftercare: Secondary | ICD-10-CM | POA: Diagnosis not present

## 2017-05-30 DIAGNOSIS — R2243 Localized swelling, mass and lump, lower limb, bilateral: Secondary | ICD-10-CM | POA: Diagnosis not present

## 2017-06-20 ENCOUNTER — Other Ambulatory Visit (HOSPITAL_COMMUNITY): Payer: Self-pay | Admitting: Family Medicine

## 2017-06-20 DIAGNOSIS — Z1231 Encounter for screening mammogram for malignant neoplasm of breast: Secondary | ICD-10-CM

## 2017-07-27 ENCOUNTER — Encounter (HOSPITAL_BASED_OUTPATIENT_CLINIC_OR_DEPARTMENT_OTHER): Payer: Self-pay

## 2017-07-27 ENCOUNTER — Other Ambulatory Visit (HOSPITAL_BASED_OUTPATIENT_CLINIC_OR_DEPARTMENT_OTHER): Payer: BLUE CROSS/BLUE SHIELD

## 2017-07-27 ENCOUNTER — Encounter: Payer: Self-pay | Admitting: Hematology & Oncology

## 2017-07-27 ENCOUNTER — Other Ambulatory Visit: Payer: Self-pay

## 2017-07-27 ENCOUNTER — Ambulatory Visit (HOSPITAL_BASED_OUTPATIENT_CLINIC_OR_DEPARTMENT_OTHER)
Admission: RE | Admit: 2017-07-27 | Discharge: 2017-07-27 | Disposition: A | Discharge status: Home / Self Care | Payer: BLUE CROSS/BLUE SHIELD | Source: Ambulatory Visit | Attending: Hematology & Oncology | Admitting: Hematology & Oncology

## 2017-07-27 ENCOUNTER — Ambulatory Visit (HOSPITAL_BASED_OUTPATIENT_CLINIC_OR_DEPARTMENT_OTHER): Payer: BLUE CROSS/BLUE SHIELD | Admitting: Hematology & Oncology

## 2017-07-27 ENCOUNTER — Encounter (HOSPITAL_COMMUNITY): Payer: Self-pay

## 2017-07-27 ENCOUNTER — Ambulatory Visit (HOSPITAL_COMMUNITY)
Admission: RE | Admit: 2017-07-27 | Discharge: 2017-07-27 | Disposition: A | Discharge status: Home / Self Care | Payer: BLUE CROSS/BLUE SHIELD | Source: Ambulatory Visit | Attending: Family Medicine | Admitting: Family Medicine

## 2017-07-27 VITALS — BP 123/68 | HR 88 | Temp 97.8°F | Resp 16

## 2017-07-27 DIAGNOSIS — C49A3 Gastrointestinal stromal tumor of small intestine: Secondary | ICD-10-CM

## 2017-07-27 DIAGNOSIS — M47894 Other spondylosis, thoracic region: Secondary | ICD-10-CM | POA: Diagnosis not present

## 2017-07-27 DIAGNOSIS — M47816 Spondylosis without myelopathy or radiculopathy, lumbar region: Secondary | ICD-10-CM | POA: Insufficient documentation

## 2017-07-27 DIAGNOSIS — D3502 Benign neoplasm of left adrenal gland: Secondary | ICD-10-CM | POA: Insufficient documentation

## 2017-07-27 DIAGNOSIS — D3500 Benign neoplasm of unspecified adrenal gland: Secondary | ICD-10-CM | POA: Diagnosis not present

## 2017-07-27 DIAGNOSIS — I700 Atherosclerosis of aorta: Secondary | ICD-10-CM | POA: Insufficient documentation

## 2017-07-27 DIAGNOSIS — Z8500 Personal history of malignant neoplasm of unspecified digestive organ: Secondary | ICD-10-CM

## 2017-07-27 DIAGNOSIS — Z1231 Encounter for screening mammogram for malignant neoplasm of breast: Secondary | ICD-10-CM | POA: Diagnosis not present

## 2017-07-27 DIAGNOSIS — J9811 Atelectasis: Secondary | ICD-10-CM | POA: Diagnosis not present

## 2017-07-27 LAB — CMP (CANCER CENTER ONLY)
ALBUMIN: 3.8 g/dL (ref 3.3–5.5)
ALT(SGPT): 19 U/L (ref 10–47)
AST: 19 U/L (ref 11–38)
Alkaline Phosphatase: 68 U/L (ref 26–84)
BUN, Bld: 10 mg/dL (ref 7–22)
CALCIUM: 9.2 mg/dL (ref 8.0–10.3)
CHLORIDE: 109 meq/L — AB (ref 98–108)
CO2: 29 mEq/L (ref 18–33)
Creat: 0.8 mg/dl (ref 0.6–1.2)
Glucose, Bld: 90 mg/dL (ref 73–118)
POTASSIUM: 3.8 meq/L (ref 3.3–4.7)
Sodium: 148 mEq/L — ABNORMAL HIGH (ref 128–145)
TOTAL PROTEIN: 6.4 g/dL (ref 6.4–8.1)
Total Bilirubin: 0.7 mg/dl (ref 0.20–1.60)

## 2017-07-27 LAB — CBC WITH DIFFERENTIAL (CANCER CENTER ONLY)
BASO#: 0 10*3/uL (ref 0.0–0.2)
BASO%: 0.3 % (ref 0.0–2.0)
EOS ABS: 0.4 10*3/uL (ref 0.0–0.5)
EOS%: 5.7 % (ref 0.0–7.0)
HCT: 37.7 % (ref 34.8–46.6)
HEMOGLOBIN: 12.4 g/dL (ref 11.6–15.9)
LYMPH#: 1.3 10*3/uL (ref 0.9–3.3)
LYMPH%: 17.5 % (ref 14.0–48.0)
MCH: 32 pg (ref 26.0–34.0)
MCHC: 32.9 g/dL (ref 32.0–36.0)
MCV: 97 fL (ref 81–101)
MONO#: 0.5 10*3/uL (ref 0.1–0.9)
MONO%: 6.4 % (ref 0.0–13.0)
NEUT%: 70.1 % (ref 39.6–80.0)
NEUTROS ABS: 5.3 10*3/uL (ref 1.5–6.5)
Platelets: 206 10*3/uL (ref 145–400)
RBC: 3.88 10*6/uL (ref 3.70–5.32)
RDW: 13.3 % (ref 11.1–15.7)
WBC: 7.5 10*3/uL (ref 3.9–10.0)

## 2017-07-27 LAB — LACTATE DEHYDROGENASE: LDH: 191 U/L (ref 125–245)

## 2017-07-27 MED ORDER — IOPAMIDOL (ISOVUE-300) INJECTION 61%
100.0000 mL | Freq: Once | INTRAVENOUS | Status: AC | PRN
  Administered 2017-07-27: 100 mL via INTRAVENOUS

## 2017-07-27 NOTE — Progress Notes (Signed)
Hematology and Oncology Follow Up Visit  Stephanie [name] [id number] 1956/07/01 61 y.o. 07/27/2017   Principle Diagnosis:  1.  Gastrointestinal stromal tumors  2. Neurofibromatosis  Current Therapy:   Gleevec 400 mg po q daily     Interim History:  Stephanie [name] is back for followup. She is doing quite well.  She is doing well.  She is still working.  She, thankfully, does not have to work on Christmas.    She did have a CT scan done today.  The CT scan did not show any evidence of recurrent soft tissue sarcoma.  She had no nausea or vomiting.  She has had no change in bowel or bladder habits.  She has had no bleeding.  She had no cough or shortness of breath.  She has had no leg swelling.  Overall, her performance status is ECOG 1.   Medications:  Current Outpatient Medications:  .  acetaminophen (TYLENOL) 500 MG tablet, Take 1,000 mg by mouth every 6 (six) hours as needed. For pain, Disp: , Rfl:  .  alendronate (FOSAMAX) 40 MG tablet, Take 40 mg by mouth every 7 (seven) days. Take with a full glass of water on an empty stomach. NOT SURE OF CORRECT STRENGTH 11-11-14, Disp: , Rfl:  .  atorvastatin (LIPITOR) 80 MG tablet, Take 40 mg by mouth every evening. , Disp: , Rfl:  .  CALCIUM-VITAMIN D PO, Take 1 tablet by mouth 2 (two) times daily. , Disp: , Rfl:  .  docusate sodium (COLACE) 100 MG capsule, Take 1 capsule (100 mg total) by mouth 2 (two) times daily. While taking narcotic pain medicine., Disp: 30 capsule, Rfl: 0 .  HYDROcodone-acetaminophen (NORCO/VICODIN) 5-325 MG tablet, Take 1-2 tablets by mouth every 4 (four) hours as needed for moderate pain or severe pain., Disp: 10 tablet, Rfl: 0 .  ibuprofen (ADVIL,MOTRIN) 200 MG tablet, Take 400 mg by mouth every 6 (six) hours as needed for moderate pain., Disp: , Rfl:  .  imatinib (GLEEVEC) 400 MG tablet, Take 1 tablet (400 mg total) by mouth daily. Take with meals and large glass of water.Caution:Chemotherapy., Disp: 90 tablet, Rfl:  3 .  Melatonin 3 MG TBDP, Take by mouth as needed., Disp: , Rfl:  .  pantoprazole (PROTONIX) 40 MG tablet, TAKE 1 TABLET EVERY MORNING, Disp: 90 tablet, Rfl: 2 .  Polyvinyl Alcohol-Povidone (REFRESH OP), Place 1 drop into both eyes as needed (dry eyes). , Disp: , Rfl:  .  senna (SENOKOT) 8.6 MG TABS tablet, Take 2 tablets (17.2 mg total) by mouth 2 (two) times daily., Disp: 30 each, Rfl: 0  Allergies: No Known Allergies  Past Medical History, Surgical history, Social history, and Family History were reviewed and updated.  Review of Systems: As stated in the interim history  Physical Exam:  oral temperature is 97.9 F (36.6 C). Her blood pressure is 120/65 and her pulse is 85. Her respiration is 16 and oxygen saturation is 100%.   I examined Stephanie [name].  The findings on my examination are noted below with appropriate changes noted:   [name] in no obvious distress. Head and neck exam shows no ocular or oral lesions. Again, she has her left eye enucleated. Right eye has good extraocular muscle movement. There is no adenopathy in the neck. Lungs are clear. Cardiac exam regular rate and rhythm with no murmurs, rubs or bruits.. Abdomen is soft. She has good bowel sounds. There is no fluid wave. There is no  palpable liver or spleen tip. She is well-healed laparotomy scar. Back exam shows no tenderness over the spine, ribs or hips. Extremities shows no clubbing, cyanosis or edema. His good range most of her joints. She has good strength in her extremities. Skin exam shows the multiple neurofibromas. No rashes are noted. Neurological exam is nonfocal.  Lab Results  Component Value Date   WBC 7.5 07/27/2017   HGB 12.4 07/27/2017   HCT 37.7 07/27/2017   MCV 97 07/27/2017   PLT 206 07/27/2017     Chemistry      Component Value Date/Time   NA 148 (H) 07/27/2017 0831   NA 141 12/28/2015 0955   K 3.8 07/27/2017 0831   K 4.2 12/28/2015 0955   CL 109 (H) 07/27/2017 0831   CO2 29  07/27/2017 0831   CO2 27 12/28/2015 0955   BUN 10 07/27/2017 0831   BUN 12.7 12/28/2015 0955   CREATININE 0.8 07/27/2017 0831   CREATININE 0.7 12/28/2015 0955      Component Value Date/Time   CALCIUM 9.2 07/27/2017 0831   CALCIUM 9.0 12/28/2015 0955   ALKPHOS 68 07/27/2017 0831   ALKPHOS 53 12/28/2015 0955   AST 19 07/27/2017 0831   AST 14 12/28/2015 0955   ALT 19 07/27/2017 0831   ALT 14 12/28/2015 0955   BILITOT 0.70 07/27/2017 0831   BILITOT 0.33 12/28/2015 0955         Impression and Plan: Stephanie [name] is 61 year old female with a GIST. Marland Kitchen This is metastatic. She did have resection of  tumor that was symptomatic. Her surgery was back in June 2015.   The CT scan is very reassuring. We don't see anything that looks like there is obvious metastatic disease.  We will see her back in 8 months.  We will get her CT scan the day we see her back.   Volanda Napoleon, MD 12/14/20189:56 AM

## 2017-09-13 ENCOUNTER — Telehealth: Payer: Self-pay | Admitting: Hematology & Oncology

## 2017-09-13 NOTE — Telephone Encounter (Signed)
Oral Oncology Patient Advocate Encounter  Stephanie [name] emailed me to let me know the patient could not afford her [place]. I called [employer] to get patient a copay card for her [place]. Called [facility] and gave them the card information.   Bin: [id number] PCN: [id number] GRP: [id number] ID: [id number]  [facility] Patient Advocate [phone] 09/13/2017 1:31 PM

## 2017-09-19 ENCOUNTER — Telehealth: Payer: Self-pay | Admitting: *Deleted

## 2017-09-19 MED ORDER — OSELTAMIVIR PHOSPHATE 75 MG PO CAPS: 75.0000 mg | ORAL_CAPSULE | Freq: Every day | ORAL | 0 refills | Status: DC

## 2017-09-19 NOTE — Telephone Encounter (Signed)
Patient states there is an influenza outbreak at her place of work and she'd like to be placed on Tamiflu prophylactically.   Spoke with Dr Marin Olp and he's fine with this. A prescription will be sent in to her pharmacy. Patient is aware of prescription.

## 2017-10-31 DIAGNOSIS — E559 Vitamin D deficiency, unspecified: Secondary | ICD-10-CM | POA: Diagnosis not present

## 2017-10-31 DIAGNOSIS — F419 Anxiety disorder, unspecified: Secondary | ICD-10-CM | POA: Diagnosis not present

## 2017-10-31 DIAGNOSIS — Z6826 Body mass index (BMI) 26.0-26.9, adult: Secondary | ICD-10-CM | POA: Diagnosis not present

## 2017-10-31 DIAGNOSIS — D490 Neoplasm of unspecified behavior of digestive system: Secondary | ICD-10-CM | POA: Diagnosis not present

## 2017-10-31 DIAGNOSIS — E782 Mixed hyperlipidemia: Secondary | ICD-10-CM | POA: Diagnosis not present

## 2017-10-31 DIAGNOSIS — M810 Age-related osteoporosis without current pathological fracture: Secondary | ICD-10-CM | POA: Diagnosis not present

## 2017-10-31 DIAGNOSIS — Q8500 Neurofibromatosis, unspecified: Secondary | ICD-10-CM | POA: Diagnosis not present

## 2017-10-31 DIAGNOSIS — Z1389 Encounter for screening for other disorder: Secondary | ICD-10-CM | POA: Diagnosis not present

## 2017-10-31 DIAGNOSIS — Z0001 Encounter for general adult medical examination with abnormal findings: Secondary | ICD-10-CM | POA: Diagnosis not present

## 2017-11-27 ENCOUNTER — Other Ambulatory Visit: Payer: Self-pay | Admitting: Hematology & Oncology

## 2017-11-27 DIAGNOSIS — C49A3 Gastrointestinal stromal tumor of small intestine: Secondary | ICD-10-CM

## 2018-01-08 ENCOUNTER — Other Ambulatory Visit: Payer: Self-pay | Admitting: *Deleted

## 2018-01-08 DIAGNOSIS — C49A3 Gastrointestinal stromal tumor of small intestine: Secondary | ICD-10-CM

## 2018-01-08 MED ORDER — IMATINIB MESYLATE 400 MG PO TABS: ORAL_TABLET | ORAL | 3 refills | Status: DC

## 2018-01-16 DIAGNOSIS — Z1389 Encounter for screening for other disorder: Secondary | ICD-10-CM | POA: Diagnosis not present

## 2018-01-16 DIAGNOSIS — Z6826 Body mass index (BMI) 26.0-26.9, adult: Secondary | ICD-10-CM | POA: Diagnosis not present

## 2018-01-16 DIAGNOSIS — M25552 Pain in left hip: Secondary | ICD-10-CM | POA: Diagnosis not present

## 2018-01-16 DIAGNOSIS — E663 Overweight: Secondary | ICD-10-CM | POA: Diagnosis not present

## 2018-03-06 ENCOUNTER — Telehealth: Payer: Self-pay | Admitting: *Deleted

## 2018-03-06 NOTE — Telephone Encounter (Signed)
Patient is having cramping with her Gleevec and Lipitor. She states the pharmacist told her this was a potential side effect of both meds. She would like to know if she can stop the lipitor.  Dr Marin Olp would like patient to hold lipitor for now, however she needs to follow up with the physician who prescribed the lipitor to see if there is an alternative therapy. She understands.

## 2018-03-13 ENCOUNTER — Other Ambulatory Visit: Payer: Self-pay | Admitting: Surgery

## 2018-03-13 DIAGNOSIS — E010 Iodine-deficiency related diffuse (endemic) goiter: Secondary | ICD-10-CM

## 2018-03-21 ENCOUNTER — Ambulatory Visit
Admission: RE | Admit: 2018-03-21 | Discharge: 2018-03-21 | Disposition: A | Discharge status: Home / Self Care | Payer: BLUE CROSS/BLUE SHIELD | Source: Ambulatory Visit | Attending: Surgery | Admitting: Surgery

## 2018-03-21 DIAGNOSIS — E041 Nontoxic single thyroid nodule: Secondary | ICD-10-CM | POA: Diagnosis not present

## 2018-03-21 DIAGNOSIS — E010 Iodine-deficiency related diffuse (endemic) goiter: Secondary | ICD-10-CM

## 2018-03-25 DIAGNOSIS — D497 Neoplasm of unspecified behavior of endocrine glands and other parts of nervous system: Secondary | ICD-10-CM | POA: Diagnosis not present

## 2018-03-25 DIAGNOSIS — C49A3 Gastrointestinal stromal tumor of small intestine: Secondary | ICD-10-CM | POA: Diagnosis not present

## 2018-03-27 ENCOUNTER — Encounter: Payer: Self-pay | Admitting: Family

## 2018-03-27 ENCOUNTER — Inpatient Hospital Stay: Payer: BLUE CROSS/BLUE SHIELD | Attending: Family | Admitting: Family

## 2018-03-27 ENCOUNTER — Other Ambulatory Visit: Payer: Self-pay

## 2018-03-27 ENCOUNTER — Ambulatory Visit (HOSPITAL_BASED_OUTPATIENT_CLINIC_OR_DEPARTMENT_OTHER)
Admission: RE | Admit: 2018-03-27 | Discharge: 2018-03-27 | Disposition: A | Discharge status: Home / Self Care | Payer: BLUE CROSS/BLUE SHIELD | Source: Ambulatory Visit | Attending: Hematology & Oncology | Admitting: Hematology & Oncology

## 2018-03-27 ENCOUNTER — Inpatient Hospital Stay: Payer: BLUE CROSS/BLUE SHIELD

## 2018-03-27 ENCOUNTER — Encounter (HOSPITAL_BASED_OUTPATIENT_CLINIC_OR_DEPARTMENT_OTHER): Payer: Self-pay

## 2018-03-27 VITALS — BP 137/76 | HR 84 | Temp 98.1°F | Resp 18 | Wt 125.5 lb

## 2018-03-27 DIAGNOSIS — C49A3 Gastrointestinal stromal tumor of small intestine: Secondary | ICD-10-CM

## 2018-03-27 DIAGNOSIS — Q056 Thoracic spina bifida without hydrocephalus: Secondary | ICD-10-CM | POA: Diagnosis not present

## 2018-03-27 DIAGNOSIS — I2510 Atherosclerotic heart disease of native coronary artery without angina pectoris: Secondary | ICD-10-CM | POA: Insufficient documentation

## 2018-03-27 DIAGNOSIS — D3502 Benign neoplasm of left adrenal gland: Secondary | ICD-10-CM | POA: Diagnosis not present

## 2018-03-27 DIAGNOSIS — C49A0 Gastrointestinal stromal tumor, unspecified site: Secondary | ICD-10-CM | POA: Diagnosis not present

## 2018-03-27 DIAGNOSIS — I700 Atherosclerosis of aorta: Secondary | ICD-10-CM | POA: Diagnosis not present

## 2018-03-27 LAB — LACTATE DEHYDROGENASE: LDH: 192 U/L (ref 98–192)

## 2018-03-27 LAB — CBC WITH DIFFERENTIAL (CANCER CENTER ONLY)
BASOS ABS: 0 10*3/uL (ref 0.0–0.1)
BASOS PCT: 1 %
Eosinophils Absolute: 0.2 10*3/uL (ref 0.0–0.5)
Eosinophils Relative: 3 %
HEMATOCRIT: 39.2 % (ref 34.8–46.6)
HEMOGLOBIN: 12.5 g/dL (ref 11.6–15.9)
Lymphocytes Relative: 21 %
Lymphs Abs: 1.2 10*3/uL (ref 0.9–3.3)
MCH: 31.2 pg (ref 26.0–34.0)
MCHC: 31.9 g/dL — ABNORMAL LOW (ref 32.0–36.0)
MCV: 97.8 fL (ref 81.0–101.0)
MONOS PCT: 6 %
Monocytes Absolute: 0.4 10*3/uL (ref 0.1–0.9)
NEUTROS ABS: 4.2 10*3/uL (ref 1.5–6.5)
NEUTROS PCT: 69 %
Platelet Count: 225 10*3/uL (ref 145–400)
RBC: 4.01 MIL/uL (ref 3.70–5.32)
RDW: 13.3 % (ref 11.1–15.7)
WBC Count: 6.1 10*3/uL (ref 3.9–10.0)

## 2018-03-27 LAB — CMP (CANCER CENTER ONLY)
ALBUMIN: 3.8 g/dL (ref 3.5–5.0)
ALK PHOS: 53 U/L (ref 26–84)
ALT: 18 U/L (ref 10–47)
AST: 20 U/L (ref 11–38)
Anion gap: 7 (ref 5–15)
BILIRUBIN TOTAL: 0.6 mg/dL (ref 0.2–1.6)
BUN: 12 mg/dL (ref 7–22)
CALCIUM: 8.9 mg/dL (ref 8.0–10.3)
CO2: 28 mmol/L (ref 18–33)
CREATININE: 0.7 mg/dL (ref 0.60–1.20)
Chloride: 107 mmol/L (ref 98–108)
Glucose, Bld: 91 mg/dL (ref 73–118)
Potassium: 4.2 mmol/L (ref 3.3–4.7)
Sodium: 142 mmol/L (ref 128–145)
Total Protein: 6.3 g/dL — ABNORMAL LOW (ref 6.4–8.1)

## 2018-03-27 LAB — TSH: TSH: 1.505 u[IU]/mL (ref 0.308–3.960)

## 2018-03-27 MED ORDER — IOPAMIDOL (ISOVUE-300) INJECTION 61%
100.0000 mL | Freq: Once | INTRAVENOUS | Status: AC | PRN
  Administered 2018-03-27: 100 mL via INTRAVENOUS

## 2018-03-27 NOTE — Progress Notes (Addendum)
Hematology and Oncology Follow Up Visit  MYLIN GIGNAC [id number] 07/27/1956 62 y.o. 03/27/2018   Principle Diagnosis:  Gastrointestinal stromal tumors  Neurofibromatosis  Current Therapy:   Gleevec 400 mg po q daily   Interim History:  Ms. Markgraf is here today for follow-up. She is doing well and has no complaints at this time. She continues to tolerate treatment with Gleevec nicely. CT scans today showed no evidence of metastatic disease.  She has had no fever, chills, n/v, cough, rash, dizziness, SOB, chest pain, palpitations, abdominal pain or changes in bowel or bladder habits.  No swelling, tenderness, numbness or tingling in her extremities. No c/o pain.  No lymphadenopathy noted on her exam today.  No episodes of bleeding, no bruising or petechiae. She has a good appetite and is staying well hydrated. Her weight is stable.   ECOG Performance Status: 1 - Symptomatic but completely ambulatory  Medications:  Allergies as of 03/27/2018   No Known Allergies     Medication List        Accurate as of 03/27/18 10:15 AM. Always use your most recent med list.          acetaminophen 500 MG tablet Commonly known as:  TYLENOL Take 1,000 mg by mouth every 6 (six) hours as needed. For pain   alendronate 40 MG tablet Commonly known as:  FOSAMAX Take 40 mg by mouth every 7 (seven) days. Take with a full glass of water on an empty stomach. NOT SURE OF CORRECT STRENGTH 11-11-14   atorvastatin 80 MG tablet Commonly known as:  LIPITOR Take 40 mg by mouth every evening.   CALCIUM-VITAMIN D PO Take 1 tablet by mouth 2 (two) times daily.   docusate sodium 100 MG capsule Commonly known as:  COLACE Take 1 capsule (100 mg total) by mouth 2 (two) times daily. While taking narcotic pain medicine.   HYDROcodone-acetaminophen 5-325 MG tablet Commonly known as:  NORCO/VICODIN Take 1-2 tablets by mouth every 4 (four) hours as needed for moderate pain or severe pain.   ibuprofen  200 MG tablet Commonly known as:  ADVIL,MOTRIN Take 400 mg by mouth every 6 (six) hours as needed for moderate pain.   imatinib 400 MG tablet Commonly known as:  GLEEVEC TAKE 1 TABLET DAILY WITH A MEAL AND LARGE GLASS OF WATER (CAUTION: CHEMOTHERAPY)   Melatonin 3 MG Tbdp Take by mouth as needed.   omeprazole 20 MG capsule Commonly known as:  PRILOSEC Take 20 mg by mouth daily.   oseltamivir 75 MG capsule Commonly known as:  TAMIFLU Take 1 capsule (75 mg total) by mouth daily.   pantoprazole 40 MG tablet Commonly known as:  PROTONIX TAKE 1 TABLET EVERY MORNING   REFRESH OP Place 1 drop into both eyes as needed (dry eyes).   senna 8.6 MG Tabs tablet Commonly known as:  SENOKOT Take 2 tablets (17.2 mg total) by mouth 2 (two) times daily.       Allergies: No Known Allergies  Past Medical History, Surgical history, Social history, and Family History were reviewed and updated.  Review of Systems: All other 10 point review of systems is negative.   Physical Exam:  weight is 125 lb 8 oz (56.9 kg). Her oral temperature is 98.1 F (36.7 C). Her blood pressure is 137/76 and her pulse is 84. Her respiration is 18 and oxygen saturation is 100%.   Wt Readings from Last 3 Encounters:  03/27/18 125 lb 8 oz (56.9 kg)  05/17/17 121  lb (54.9 kg)  12/18/16 124 lb (56.2 kg)    Ocular: Sclerae unicteric, pupils equal, round and reactive to light Ear-nose-throat: Oropharynx clear, dentition fair Lymphatic: No cervical, supraclavicular or axillary adenopathy Lungs no rales or rhonchi, good excursion bilaterally Heart regular rate and rhythm, no murmur appreciated Abd soft, nontender, positive bowel sounds, no liver or spleen tip palpated on exam, no fluid wave  MSK no focal spinal tenderness, no joint edema Neuro: non-focal, well-oriented, appropriate affect Breasts: Deferred   Lab Results  Component Value Date   WBC 6.1 03/27/2018   HGB 12.5 03/27/2018   HCT 39.2 03/27/2018     MCV 97.8 03/27/2018   PLT 225 03/27/2018   No results found for: FERRITIN, IRON, TIBC, UIBC, IRONPCTSAT Lab Results  Component Value Date   RBC 4.01 03/27/2018   No results found for: KPAFRELGTCHN, LAMBDASER, KAPLAMBRATIO No results found for: IGGSERUM, IGA, IGMSERUM No results found for: Odetta Pink, SPEI   Chemistry      Component Value Date/Time   NA 142 03/27/2018 0752   NA 148 (H) 07/27/2017 0831   NA 141 12/28/2015 0955   K 4.2 03/27/2018 0752   K 3.8 07/27/2017 0831   K 4.2 12/28/2015 0955   CL 107 03/27/2018 0752   CL 109 (H) 07/27/2017 0831   CO2 28 03/27/2018 0752   CO2 29 07/27/2017 0831   CO2 27 12/28/2015 0955   BUN 12 03/27/2018 0752   BUN 10 07/27/2017 0831   BUN 12.7 12/28/2015 0955   CREATININE 0.70 03/27/2018 0752   CREATININE 0.8 07/27/2017 0831   CREATININE 0.7 12/28/2015 0955      Component Value Date/Time   CALCIUM 8.9 03/27/2018 0752   CALCIUM 9.2 07/27/2017 0831   CALCIUM 9.0 12/28/2015 0955   ALKPHOS 53 03/27/2018 0752   ALKPHOS 68 07/27/2017 0831   ALKPHOS 53 12/28/2015 0955   AST 20 03/27/2018 0752   AST 14 12/28/2015 0955   ALT 18 03/27/2018 0752   ALT 19 07/27/2017 0831   ALT 14 12/28/2015 0955   BILITOT 0.6 03/27/2018 0752   BILITOT 0.33 12/28/2015 0955      Impression and Plan: Ms. Head is a very pleasant 62 yo caucasian female with history of metastatic GIST. This was resected in June 2015. She continues to tolerate treatment with Gleevec nicely.  CT scans today showed no evidence of metastatic disease. We will continues to follow along with her and plan to see her back in another 8 months for CT scans, follow-up and lab.  Labs from today forwarded to Dr. Armandina Gemma.  She promises to contact our office with any other questions or concerns. We can certainly see her sooner if need be.   [name], NP 8/14/201910:15 AM

## 2018-06-26 ENCOUNTER — Other Ambulatory Visit (HOSPITAL_COMMUNITY): Payer: Self-pay | Admitting: Family Medicine

## 2018-06-26 DIAGNOSIS — Z1231 Encounter for screening mammogram for malignant neoplasm of breast: Secondary | ICD-10-CM

## 2018-07-01 ENCOUNTER — Other Ambulatory Visit (HOSPITAL_COMMUNITY): Payer: Self-pay | Admitting: Family Medicine

## 2018-07-01 DIAGNOSIS — Z780 Asymptomatic menopausal state: Secondary | ICD-10-CM

## 2018-07-01 DIAGNOSIS — E2839 Other primary ovarian failure: Secondary | ICD-10-CM

## 2018-07-29 ENCOUNTER — Ambulatory Visit (HOSPITAL_COMMUNITY)
Admission: RE | Admit: 2018-07-29 | Discharge: 2018-07-29 | Disposition: A | Discharge status: Home / Self Care | Payer: BLUE CROSS/BLUE SHIELD | Source: Ambulatory Visit | Attending: Family Medicine | Admitting: Family Medicine

## 2018-07-29 DIAGNOSIS — Z1231 Encounter for screening mammogram for malignant neoplasm of breast: Secondary | ICD-10-CM | POA: Diagnosis not present

## 2018-07-30 ENCOUNTER — Ambulatory Visit (HOSPITAL_COMMUNITY)
Admission: RE | Admit: 2018-07-30 | Discharge: 2018-07-30 | Disposition: A | Discharge status: Home / Self Care | Payer: BLUE CROSS/BLUE SHIELD | Source: Ambulatory Visit | Attending: Family Medicine | Admitting: Family Medicine

## 2018-07-30 DIAGNOSIS — Z780 Asymptomatic menopausal state: Secondary | ICD-10-CM

## 2018-07-30 DIAGNOSIS — E2839 Other primary ovarian failure: Secondary | ICD-10-CM | POA: Diagnosis not present

## 2018-07-30 DIAGNOSIS — M85832 Other specified disorders of bone density and structure, left forearm: Secondary | ICD-10-CM | POA: Diagnosis not present

## 2018-08-02 ENCOUNTER — Other Ambulatory Visit (HOSPITAL_COMMUNITY): Payer: BLUE CROSS/BLUE SHIELD

## 2018-09-09 ENCOUNTER — Telehealth: Payer: Self-pay | Admitting: *Deleted

## 2018-09-09 NOTE — Telephone Encounter (Signed)
Call received from patient stating that she cannot afford $75.00 copay for [place].  Call placed back to patient to inform her that I would contact our oral pharmacist regarding [place] prescription.  Nuala Alpha PharmD notified of pt.'s call and states that she will look into assistance for patient.  Call placed back to patient and pt notified that Alyson will be assisting her with [place].  Patient appreciative of calls and has no further questions at this time.

## 2018-09-10 ENCOUNTER — Telehealth: Payer: Self-pay | Admitting: Pharmacy Technician

## 2018-09-10 NOTE — Telephone Encounter (Signed)
Oral Oncology [name] Advocate Encounter  After receiving a message from Thane Edu regarding the [name] being unable to afford her copay for generic Gleevec, I called the insurance company to see if they would cover brand Gleevec instead.  No prior authorization was needed.  I called [facility] and the technician was able to process the rx for Brand Gleevec and apply the copay card that I obtained for the [name].  Her copay was reduced to $10.     Was successful in obtaining a copay card for Gleevec (Brand only).  This copay card will make the patients copay $10.  I have spoken with the [name].    The billing information is as follows and has been shared with [facility].   RxBin: [id number] PCN: [id number] Member ID: [id number] Group ID: [id number]   Stephanie [name] Abrams Phone [phone] Fax [phone] 09/10/2018 1:52 PM

## 2018-10-05 IMAGING — US US THYROID
1 series · 14 of 25 positions shown · non-contrast
Comparison: 03/30/2017

CLINICAL DATA: Palpable abnormality.  Thyromegaly.

EXAM:
THYROID ULTRASOUND
TECHNIQUE: Ultrasound examination of the thyroid gland and adjacent soft
tissues was performed.

[Series 1: us thyroid · 0.06mm/px · 14 of 37 slices shown]
[im 1/37]
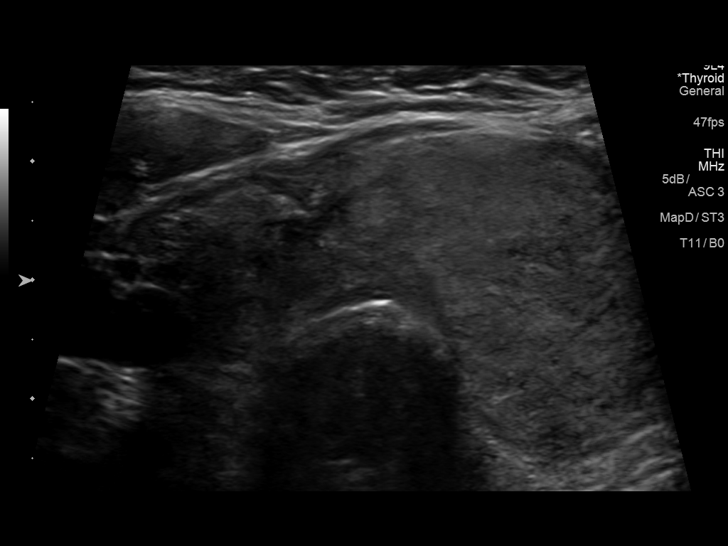
[im 4/37]
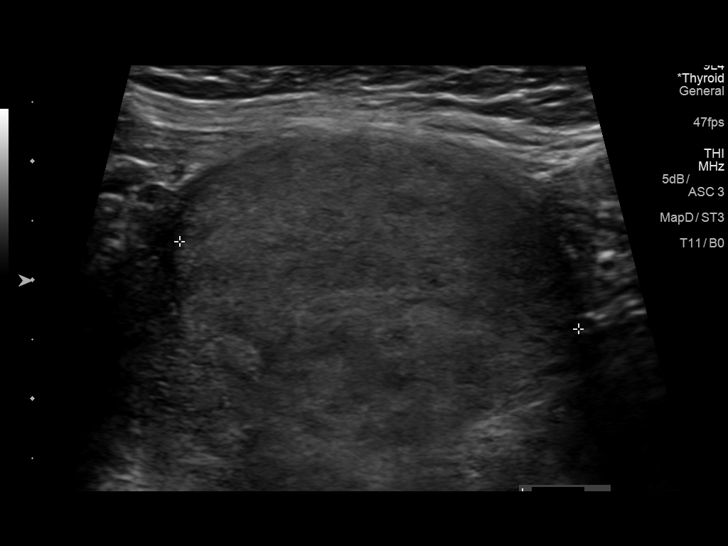
[im 7/37]
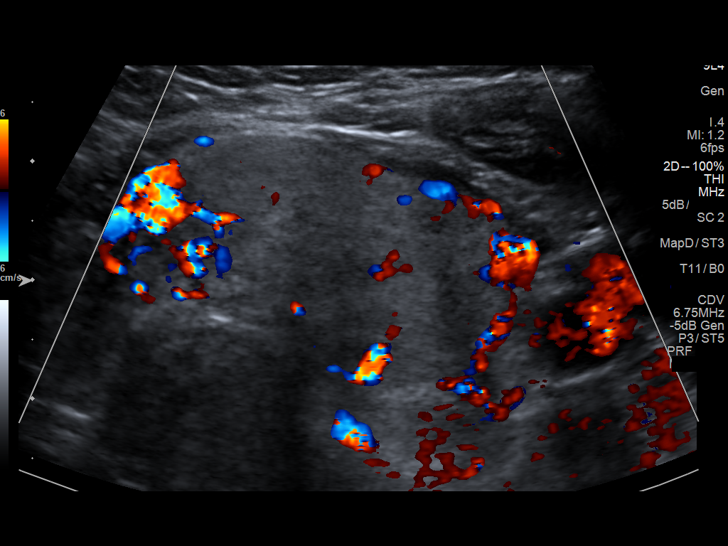
[im 10/37]
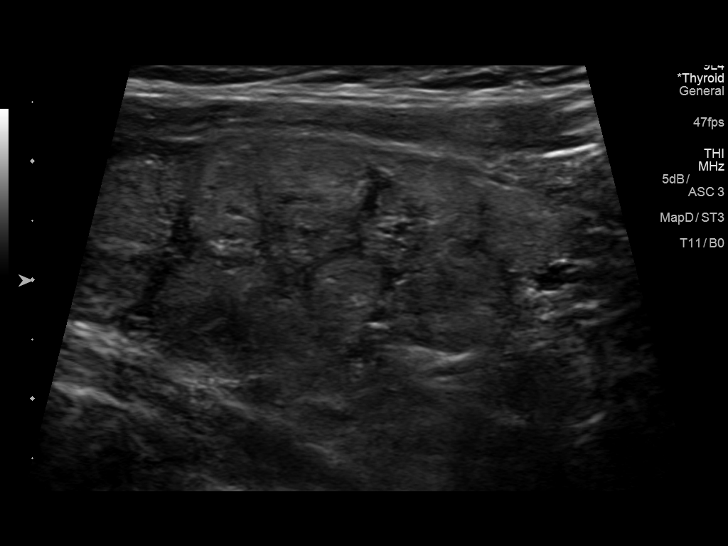
[im 13/37]
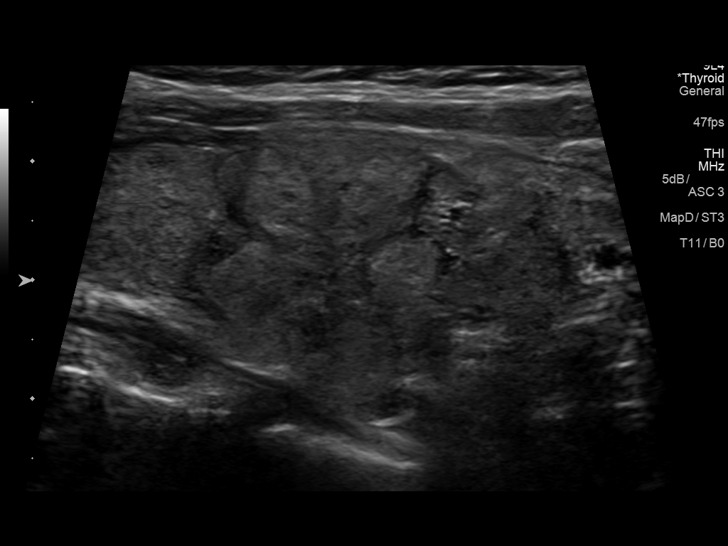
[im 14/37]
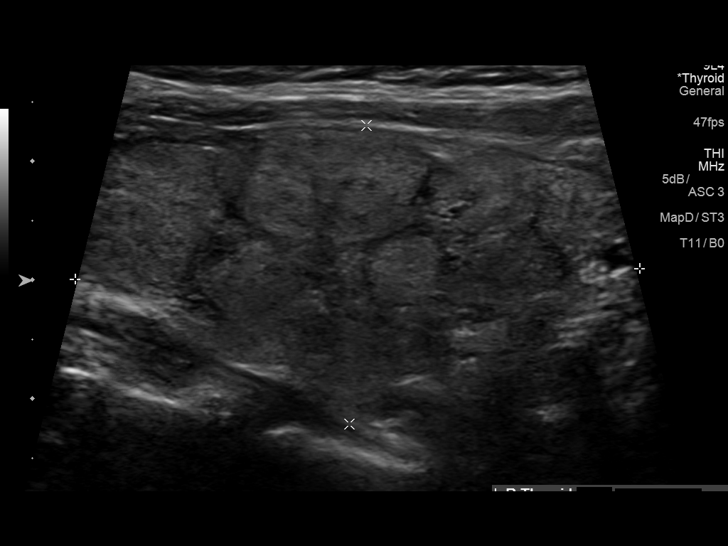
[im 17/37]
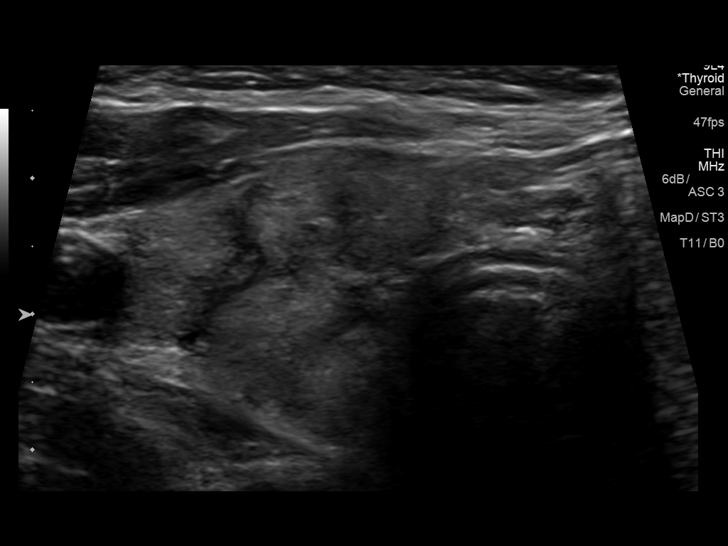
[im 20/37]
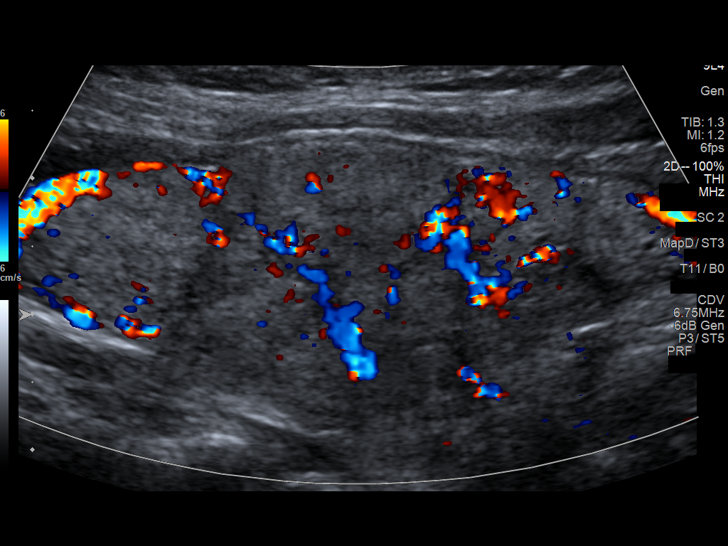
[im 23/37]
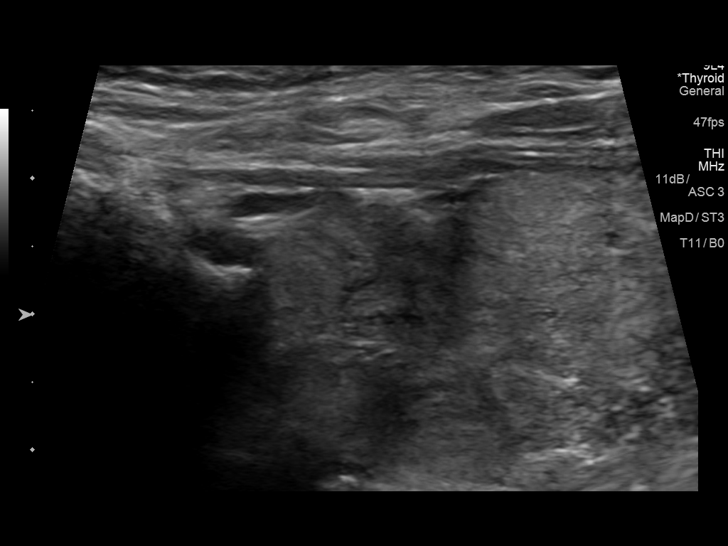
[im 25/37]
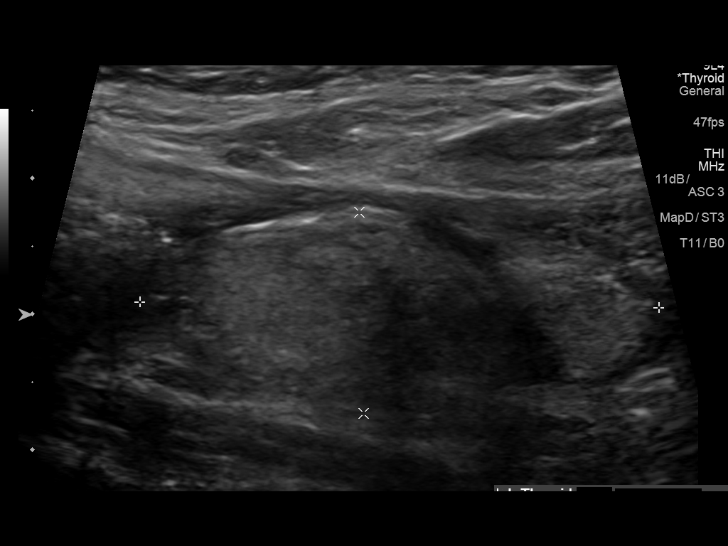
[im 28/37]
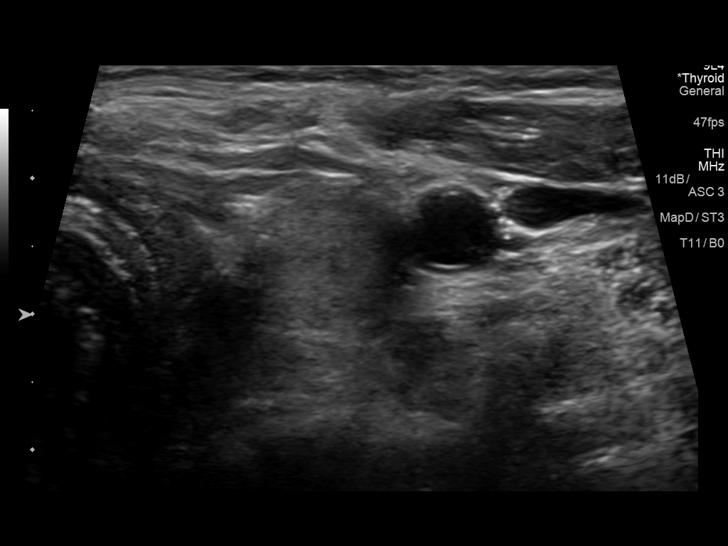
[im 31/37]
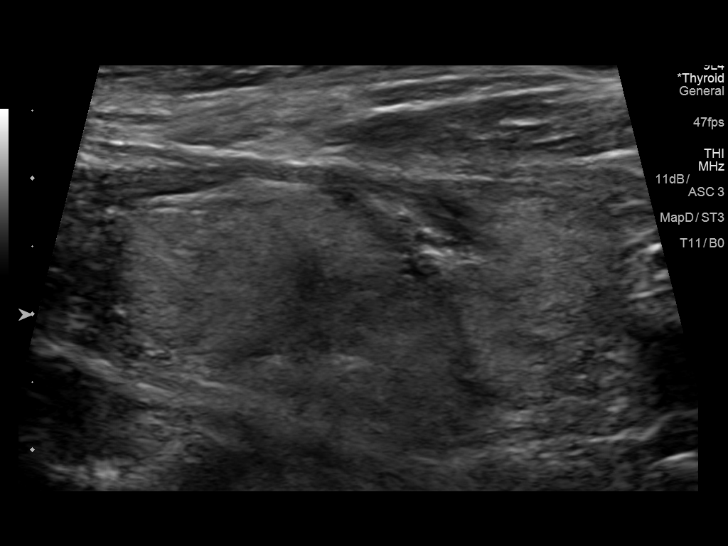
[im 34/37]
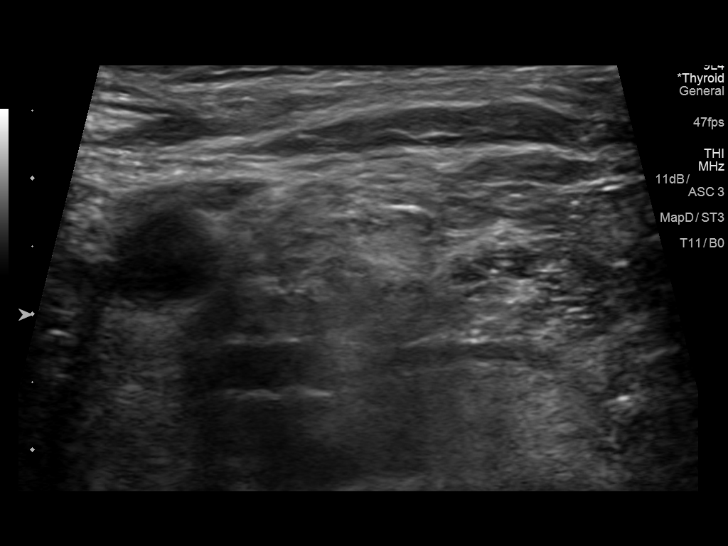
[im 37/37]
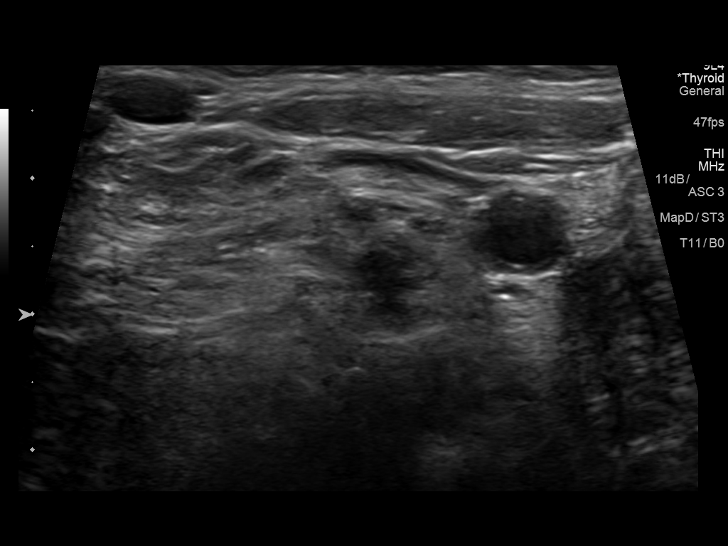

[14 of 25 positions shown; findings below may reference images not displayed]

FINDINGS: Parenchymal Echotexture: Markedly heterogenous

Isthmus: 1.0 cm, previously 0.7 cm

Right lobe: 4.8 x 2.5 x 2.3 cm, previously 5.2 x 2.0 x 1.5 cm

Left lobe: 3.8 x 1.5 x 1.5 cm, 3.7 x 1.9 x 1.4 cm

_________________________________________________________

Estimated total number of nodules >/= 1 cm: 1

Number of spongiform nodules >/=  2 cm not described below (TR1): 0

Number of mixed cystic and solid nodules >/= 1.5 cm not described
below (TR2): 0

_________________________________________________________

Isthmus nodule measures 3.4 x 3.4 x 2.1 cm and previously measured
3.2 x 3.1 x 1.8 cm. Biopsy was performed 04/11/2017.
IMPRESSION: Stable nodule in the isthmus which was previously biopsied.

The above is in keeping with the ACR TI-RADS recommendations - [HOSPITAL] 6131;[DATE].

## 2018-11-05 DIAGNOSIS — E7849 Other hyperlipidemia: Secondary | ICD-10-CM | POA: Diagnosis not present

## 2018-11-05 DIAGNOSIS — Q8500 Neurofibromatosis, unspecified: Secondary | ICD-10-CM | POA: Diagnosis not present

## 2018-11-05 DIAGNOSIS — E663 Overweight: Secondary | ICD-10-CM | POA: Diagnosis not present

## 2018-11-05 DIAGNOSIS — D490 Neoplasm of unspecified behavior of digestive system: Secondary | ICD-10-CM | POA: Diagnosis not present

## 2018-11-05 DIAGNOSIS — Z0001 Encounter for general adult medical examination with abnormal findings: Secondary | ICD-10-CM | POA: Diagnosis not present

## 2018-11-05 DIAGNOSIS — E559 Vitamin D deficiency, unspecified: Secondary | ICD-10-CM | POA: Diagnosis not present

## 2018-11-05 DIAGNOSIS — Z1389 Encounter for screening for other disorder: Secondary | ICD-10-CM | POA: Diagnosis not present

## 2018-11-05 DIAGNOSIS — Z6825 Body mass index (BMI) 25.0-25.9, adult: Secondary | ICD-10-CM | POA: Diagnosis not present

## 2018-11-21 ENCOUNTER — Telehealth: Payer: Self-pay | Admitting: Pharmacist

## 2018-11-21 NOTE — Telephone Encounter (Signed)
Oral Oncology Pharmacist Encounter   Received notification from [place] that reauthorization for [place] is required.   PA submitted on [employer] Key [id number] Status is pending   Oral Oncology Clinic will continue to follow.   Darl Pikes, PharmD, BCPS, [facility] Hematology/Oncology Clinical Pharmacist [facility] [phone]  11/21/2018 9:53 AM

## 2018-11-25 ENCOUNTER — Other Ambulatory Visit: Payer: Self-pay | Admitting: *Deleted

## 2018-11-25 DIAGNOSIS — C49A3 Gastrointestinal stromal tumor of small intestine: Secondary | ICD-10-CM

## 2018-11-26 ENCOUNTER — Telehealth: Payer: Self-pay | Admitting: Hematology & Oncology

## 2018-11-26 ENCOUNTER — Inpatient Hospital Stay: Payer: BLUE CROSS/BLUE SHIELD | Attending: Hematology & Oncology | Admitting: Hematology & Oncology

## 2018-11-26 ENCOUNTER — Ambulatory Visit (HOSPITAL_BASED_OUTPATIENT_CLINIC_OR_DEPARTMENT_OTHER)
Admission: RE | Admit: 2018-11-26 | Discharge: 2018-11-26 | Disposition: A | Discharge status: Home / Self Care | Payer: BLUE CROSS/BLUE SHIELD | Source: Ambulatory Visit | Attending: Family | Admitting: Family

## 2018-11-26 ENCOUNTER — Encounter (HOSPITAL_BASED_OUTPATIENT_CLINIC_OR_DEPARTMENT_OTHER): Payer: Self-pay

## 2018-11-26 ENCOUNTER — Other Ambulatory Visit: Payer: Self-pay

## 2018-11-26 ENCOUNTER — Inpatient Hospital Stay: Payer: BLUE CROSS/BLUE SHIELD

## 2018-11-26 ENCOUNTER — Telehealth: Payer: Self-pay | Admitting: *Deleted

## 2018-11-26 VITALS — BP 147/78 | HR 84 | Temp 97.9°F | Resp 19 | Wt 126.1 lb

## 2018-11-26 DIAGNOSIS — C49A3 Gastrointestinal stromal tumor of small intestine: Secondary | ICD-10-CM | POA: Diagnosis not present

## 2018-11-26 DIAGNOSIS — Q8500 Neurofibromatosis, unspecified: Secondary | ICD-10-CM | POA: Insufficient documentation

## 2018-11-26 DIAGNOSIS — Z85038 Personal history of other malignant neoplasm of large intestine: Secondary | ICD-10-CM | POA: Diagnosis not present

## 2018-11-26 DIAGNOSIS — R918 Other nonspecific abnormal finding of lung field: Secondary | ICD-10-CM | POA: Diagnosis not present

## 2018-11-26 DIAGNOSIS — Z8509 Personal history of malignant neoplasm of other digestive organs: Secondary | ICD-10-CM | POA: Diagnosis not present

## 2018-11-26 LAB — CBC WITH DIFFERENTIAL (CANCER CENTER ONLY)
Abs Immature Granulocytes: 0.03 10*3/uL (ref 0.00–0.07)
Basophils Absolute: 0.1 10*3/uL (ref 0.0–0.1)
Basophils Relative: 1 %
Eosinophils Absolute: 0.2 10*3/uL (ref 0.0–0.5)
Eosinophils Relative: 4 %
HCT: 38.9 % (ref 36.0–46.0)
Hemoglobin: 12.4 g/dL (ref 12.0–15.0)
Immature Granulocytes: 1 %
Lymphocytes Relative: 21 %
Lymphs Abs: 1.4 10*3/uL (ref 0.7–4.0)
MCH: 30.7 pg (ref 26.0–34.0)
MCHC: 31.9 g/dL (ref 30.0–36.0)
MCV: 96.3 fL (ref 80.0–100.0)
Monocytes Absolute: 0.4 10*3/uL (ref 0.1–1.0)
Monocytes Relative: 6 %
Neutro Abs: 4.5 10*3/uL (ref 1.7–7.7)
Neutrophils Relative %: 67 %
Platelet Count: 235 10*3/uL (ref 150–400)
RBC: 4.04 MIL/uL (ref 3.87–5.11)
RDW: 14 % (ref 11.5–15.5)
WBC Count: 6.6 10*3/uL (ref 4.0–10.5)
nRBC: 0 % (ref 0.0–0.2)

## 2018-11-26 LAB — CMP (CANCER CENTER ONLY)
ALT: 13 U/L (ref 0–44)
AST: 16 U/L (ref 15–41)
Albumin: 4.3 g/dL (ref 3.5–5.0)
Alkaline Phosphatase: 52 U/L (ref 38–126)
Anion gap: 5 (ref 5–15)
BUN: 11 mg/dL (ref 6–20)
CO2: 31 mmol/L (ref 22–32)
Calcium: 9.1 mg/dL (ref 8.9–10.3)
Chloride: 104 mmol/L (ref 98–111)
Creatinine: 0.72 mg/dL (ref 0.44–1.00)
GFR, Est AFR Am: >60 mL/min (ref >60)
GFR, Estimated: >60 mL/min (ref >60)
Glucose, Bld: 96 mg/dL (ref 70–99)
Potassium: 4.3 mmol/L (ref 3.5–5.1)
Sodium: 140 mmol/L (ref 135–145)
Total Bilirubin: 0.5 mg/dL (ref 0.3–1.2)
Total Protein: 6.4 g/dL — ABNORMAL LOW (ref 6.5–8.1)

## 2018-11-26 LAB — LACTATE DEHYDROGENASE: LDH: 175 U/L (ref 98–192)

## 2018-11-26 MED ORDER — IOHEXOL 300 MG/ML  SOLN
100.0000 mL | Freq: Once | INTRAMUSCULAR | Status: AC | PRN
Start: 2018-11-26 — End: 2018-11-26
  Administered 2018-11-26: 100 mL via INTRAVENOUS

## 2018-11-26 NOTE — Telephone Encounter (Signed)
-----   Message from Volanda Napoleon, MD sent at 11/26/2018 11:59 AM EDT ----- Call - NO evidence of cancer!!  Great job!!  Stephanie [name]

## 2018-11-26 NOTE — Telephone Encounter (Signed)
sched appt per 4/14 LOS and printed avs

## 2018-11-26 NOTE — Telephone Encounter (Signed)
Patient notified per order of Dr. Marin Olp that there is no evidence of cancer from CT scan this morning.  Patient appreciative of call and has no questions or concerns at this time.

## 2018-11-26 NOTE — Progress Notes (Signed)
Hematology and Oncology Follow Up Visit  Stephanie [name] [id number] 12/03/1955 63 y.o. 11/26/2018   Principle Diagnosis:  1.  Gastrointestinal stromal tumors  2. Neurofibromatosis  Current Therapy:   Gleevec 400 mg po q daily     Interim History:  [name] is back for followup.  She is still doing okay.  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  She had a CT scan done today.  The results are still pending.  She is on Gleevec.  I think she is done well with Gleevec to date.  There is no abdominal pain.  She has had no change in bowel or bladder habits.  She has had no nausea or vomiting.  She has had no rashes.  She has had no fever.  Overall, her performance status is ECOG 1.   Medications:  Current Outpatient Medications:  .  acetaminophen (TYLENOL) 500 MG tablet, Take 1,000 mg by mouth every 6 (six) hours as needed. For pain, Disp: , Rfl:  .  alendronate (FOSAMAX) 40 MG tablet, Take 40 mg by mouth every 7 (seven) days. Take with a full glass of water on an empty stomach. NOT SURE OF CORRECT STRENGTH 11-11-14, Disp: , Rfl:  .  atorvastatin (LIPITOR) 80 MG tablet, Take 40 mg by mouth every evening. , Disp: , Rfl:  .  CALCIUM-VITAMIN D PO, Take 1 tablet by mouth 2 (two) times daily. , Disp: , Rfl:  .  docusate sodium (COLACE) 100 MG capsule, Take 1 capsule (100 mg total) by mouth 2 (two) times daily. While taking narcotic pain medicine. (Patient not taking: Reported on 03/27/2018), Disp: 30 capsule, Rfl: 0 .  HYDROcodone-acetaminophen (NORCO/VICODIN) 5-325 MG tablet, Take 1-2 tablets by mouth every 4 (four) hours as needed for moderate pain or severe pain. (Patient not taking: Reported on 03/27/2018), Disp: 10 tablet, Rfl: 0 .  ibuprofen (ADVIL,MOTRIN) 200 MG tablet, Take 400 mg by mouth every 6 (six) hours as needed for moderate pain., Disp: , Rfl:  .  imatinib (GLEEVEC) 400 MG tablet, TAKE 1 TABLET DAILY WITH A MEAL  AND LARGE GLASS OF WATER (CAUTION: CHEMOTHERAPY), Disp: 90 tablet, Rfl: 3 .  Melatonin 3 MG TBDP, Take by mouth as needed., Disp: , Rfl:  .  omeprazole (PRILOSEC) 20 MG capsule, Take 20 mg by mouth daily., Disp: , Rfl:  .  oseltamivir (TAMIFLU) 75 MG capsule, Take 1 capsule (75 mg total) by mouth daily. (Patient not taking: Reported on 03/27/2018), Disp: 7 capsule, Rfl: 0 .  pantoprazole (PROTONIX) 40 MG tablet, TAKE 1 TABLET EVERY MORNING (Patient not taking: Reported on 03/27/2018), Disp: 90 tablet, Rfl: 2 .  Polyvinyl Alcohol-Povidone (REFRESH OP), Place 1 drop into both eyes as needed (dry eyes). , Disp: , Rfl:  .  senna (SENOKOT) 8.6 MG TABS tablet, Take 2 tablets (17.2 mg total) by mouth 2 (two) times daily. (Patient not taking: Reported on 03/27/2018), Disp: 30 each, Rfl: 0  Allergies: No Known Allergies  Past Medical History, Surgical history, Social history, and Family History were reviewed and updated.  Review of Systems: Review of Systems  Constitutional: Negative.   HENT: Negative.   Eyes: Negative.   Respiratory: Negative.   Cardiovascular: Negative.   Gastrointestinal: Negative.   Genitourinary: Negative.   Musculoskeletal: Negative.   Skin: Negative.   Neurological: Negative.   Endo/Heme/Allergies: Negative.   Psychiatric/Behavioral: Negative.      Physical Exam:  weight is 126 lb 1.9 oz (57.2 kg). Her oral temperature is 97.9 F (36.6 C). Her blood pressure is 147/78 (abnormal) and her pulse is 84. Her respiration is 19 and oxygen saturation is 100%.   Physical Exam Vitals signs reviewed.  HENT:     Head: Normocephalic and atraumatic.  Eyes:     Pupils: Pupils are equal, round, and reactive to light.  Neck:     Musculoskeletal: Normal range of motion.  Cardiovascular:     Rate and Rhythm: Normal rate and regular rhythm.     Heart sounds: Normal heart sounds.  Pulmonary:     Effort: Pulmonary effort is normal.     Breath sounds: Normal breath sounds.   Abdominal:     General: Bowel sounds are normal.     Palpations: Abdomen is soft.     Comments: Abdominal exam is soft.  She has well-healed laparotomy scar.  She has no fluid wave.  There is no abdominal mass.  There is no palpable liver or spleen tip.  Musculoskeletal: Normal range of motion.        General: No tenderness or deformity.  Lymphadenopathy:     Cervical: No cervical adenopathy.  Skin:    General: Skin is warm and dry.     Findings: No erythema or rash.     Comments: Skin exam shows numerous neurofibromas.  Neurological:     Mental Status: She is alert and oriented to person, place, and time.  Psychiatric:        Behavior: Behavior normal.        Thought Content: Thought content normal.        Judgment: Judgment normal.      Lab Results  Component Value Date   WBC 6.6 11/26/2018   HGB 12.4 11/26/2018   HCT 38.9 11/26/2018   MCV 96.3 11/26/2018   PLT 235 11/26/2018     Chemistry      Component Value Date/Time   NA 140 11/26/2018 0818   NA 148 (H) 07/27/2017 0831   NA 141 12/28/2015 0955   K 4.3 11/26/2018 0818   K 3.8 07/27/2017 0831   K 4.2 12/28/2015 0955   CL 104 11/26/2018 0818   CL 109 (H) 07/27/2017 0831   CO2 31 11/26/2018 0818   CO2 29 07/27/2017 0831   CO2 27 12/28/2015 0955   BUN 11 11/26/2018 0818   BUN 10 07/27/2017 0831   BUN 12.7 12/28/2015 0955   CREATININE 0.72 11/26/2018 0818   CREATININE 0.8 07/27/2017 0831   CREATININE 0.7 12/28/2015 0955      Component Value Date/Time   CALCIUM 9.1 11/26/2018 0818   CALCIUM 9.2 07/27/2017 0831   CALCIUM 9.0 12/28/2015 0955   ALKPHOS 52 11/26/2018 0818   ALKPHOS 68 07/27/2017 0831   ALKPHOS 53 12/28/2015 0955   AST 16 11/26/2018 0818   AST 14 12/28/2015 0955   ALT 13 11/26/2018 0818   ALT 19 07/27/2017 0831   ALT 14 12/28/2015 0955   BILITOT 0.5 11/26/2018 0818   BILITOT 0.33 12/28/2015 0955         Impression and Plan: Stephanie [name] is 63 year old female with a GIST. Marland Kitchen This is  metastatic. She did have resection of  tumor that was symptomatic. Her surgery was back in June 2015.   Hopefully, the CT scan would not shows any issues with respect to her malignancy.  I do not think we have to do another CT scan for about a year.  Is been almost 5 years since she has had surgery.  She is totally asymptomatic right now.  We will get her back in 8 more months.  This way we will get her through the majority of the year.  Hopefully, the whole coronavirus will be over with.   Volanda Napoleon, MD 4/14/202011:04 AM

## 2018-11-26 NOTE — Telephone Encounter (Signed)
Called pt, relayed results to pt. No further concerns.

## 2018-11-26 NOTE — Telephone Encounter (Signed)
Oral Chemotherapy Pharmacist Encounter   Called to check on the status of the [place] PA. Per [employer], a PA is not required. Called [facility] to let them know.  Darl Pikes, PharmD, BCPS, [facility] Hematology/Oncology Clinical Pharmacist [facility]/HP/AP Oral [facility] [phone]  11/26/2018 11:37 AM

## 2019-01-31 ENCOUNTER — Emergency Department (HOSPITAL_COMMUNITY)
Admission: EM | Admit: 2019-01-31 | Discharge: 2019-01-31 | Disposition: A | Discharge status: Home / Self Care | Payer: BC Managed Care – PPO | Attending: Emergency Medicine | Admitting: Emergency Medicine

## 2019-01-31 ENCOUNTER — Other Ambulatory Visit: Payer: Self-pay

## 2019-01-31 ENCOUNTER — Encounter (HOSPITAL_COMMUNITY): Payer: Self-pay

## 2019-01-31 ENCOUNTER — Emergency Department (HOSPITAL_COMMUNITY): Payer: BC Managed Care – PPO

## 2019-01-31 DIAGNOSIS — R072 Precordial pain: Secondary | ICD-10-CM | POA: Diagnosis not present

## 2019-01-31 DIAGNOSIS — R079 Chest pain, unspecified: Secondary | ICD-10-CM | POA: Diagnosis not present

## 2019-01-31 DIAGNOSIS — Z85840 Personal history of malignant neoplasm of eye: Secondary | ICD-10-CM | POA: Insufficient documentation

## 2019-01-31 LAB — CBC WITH DIFFERENTIAL/PLATELET
Abs Immature Granulocytes: 0.02 10*3/uL (ref 0.00–0.07)
Basophils Absolute: 0.1 10*3/uL (ref 0.0–0.1)
Basophils Relative: 1 %
Eosinophils Absolute: 0.2 10*3/uL (ref 0.0–0.5)
Eosinophils Relative: 4 %
HCT: 38.9 % (ref 36.0–46.0)
Hemoglobin: 12.1 g/dL (ref 12.0–15.0)
Immature Granulocytes: 0 %
Lymphocytes Relative: 20 %
Lymphs Abs: 1.2 10*3/uL (ref 0.7–4.0)
MCH: 30.7 pg (ref 26.0–34.0)
MCHC: 31.1 g/dL (ref 30.0–36.0)
MCV: 98.7 fL (ref 80.0–100.0)
Monocytes Absolute: 0.4 10*3/uL (ref 0.1–1.0)
Monocytes Relative: 8 %
Neutro Abs: 3.9 10*3/uL (ref 1.7–7.7)
Neutrophils Relative %: 67 %
Platelets: 202 10*3/uL (ref 150–400)
RBC: 3.94 MIL/uL (ref 3.87–5.11)
RDW: 13.7 % (ref 11.5–15.5)
WBC: 5.8 10*3/uL (ref 4.0–10.5)
nRBC: 0 % (ref 0.0–0.2)

## 2019-01-31 LAB — BASIC METABOLIC PANEL
Anion gap: 11 (ref 5–15)
BUN: 10 mg/dL (ref 8–23)
CO2: 25 mmol/L (ref 22–32)
Calcium: 8.5 mg/dL — ABNORMAL LOW (ref 8.9–10.3)
Chloride: 108 mmol/L (ref 98–111)
Creatinine, Ser: 0.64 mg/dL (ref 0.44–1.00)
GFR calc Af Amer: >60 mL/min (ref >60)
GFR calc non Af Amer: >60 mL/min (ref >60)
Glucose, Bld: 92 mg/dL (ref 70–99)
Potassium: 4.1 mmol/L (ref 3.5–5.1)
Sodium: 144 mmol/L (ref 135–145)

## 2019-01-31 LAB — TROPONIN I
Troponin I: <0.03 ng/mL (ref <0.03)
Troponin I: <0.03 ng/mL (ref <0.03)

## 2019-01-31 NOTE — ED Triage Notes (Signed)
Pt has been having recurrent chest discomfort for the last few months and has spoken to PCP about it. Went to PCP this morning and advised to come here. Chest pain started this morning. Left sided chest pain that she describes as pressure.
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
## 2019-01-31 NOTE — Discharge Instructions (Signed)

## 2019-02-07 DIAGNOSIS — M940 Chondrocostal junction syndrome [Tietze]: Secondary | ICD-10-CM | POA: Diagnosis not present

## 2019-02-07 DIAGNOSIS — Z6826 Body mass index (BMI) 26.0-26.9, adult: Secondary | ICD-10-CM | POA: Diagnosis not present

## 2019-02-07 DIAGNOSIS — E663 Overweight: Secondary | ICD-10-CM | POA: Diagnosis not present

## 2019-02-07 DIAGNOSIS — R0789 Other chest pain: Secondary | ICD-10-CM | POA: Diagnosis not present

## 2019-02-12 ENCOUNTER — Other Ambulatory Visit: Payer: Self-pay

## 2019-02-12 ENCOUNTER — Ambulatory Visit (INDEPENDENT_AMBULATORY_CARE_PROVIDER_SITE_OTHER): Payer: BC Managed Care – PPO | Admitting: Cardiovascular Disease

## 2019-02-12 ENCOUNTER — Ambulatory Visit: Payer: BC Managed Care – PPO | Admitting: Cardiovascular Disease

## 2019-02-12 ENCOUNTER — Encounter: Payer: Self-pay | Admitting: Cardiovascular Disease

## 2019-02-12 VITALS — BP 127/74 | HR 89 | Temp 98.7°F | Ht 59.0 in | Wt 128.0 lb

## 2019-02-12 DIAGNOSIS — R0789 Other chest pain: Secondary | ICD-10-CM
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
## 2019-02-12 NOTE — Patient Instructions (Signed)
Medication Instructions: Your physician recommends that you continue on your current medications as directed. Please refer to the Current Medication list given to you today.  Labwork: none  Procedures/Testing: None  Follow-Up: As needed  Any Additional Special Instructions Will Be Listed Below (If Applicable).    Thank you for choosing [facility] !

## 2019-06-19 ENCOUNTER — Other Ambulatory Visit (HOSPITAL_COMMUNITY): Payer: Self-pay | Admitting: Family Medicine

## 2019-06-19 DIAGNOSIS — Z1231 Encounter for screening mammogram for malignant neoplasm of breast: Secondary | ICD-10-CM

## 2019-07-17 DIAGNOSIS — Z20828 Contact with and (suspected) exposure to other viral communicable diseases: Secondary | ICD-10-CM | POA: Diagnosis not present

## 2019-07-21 ENCOUNTER — Telehealth: Payer: Self-pay | Admitting: *Deleted

## 2019-07-21 NOTE — Telephone Encounter (Signed)
Message received from patient stating that she was diagnosed with Covid-19 last Thursday and will need to reschedule her appt scheduled for 07/28/19.  Message sent to scheduling.

## 2019-07-28 ENCOUNTER — Ambulatory Visit: Payer: BLUE CROSS/BLUE SHIELD | Admitting: Hematology & Oncology

## 2019-07-28 ENCOUNTER — Other Ambulatory Visit: Payer: BLUE CROSS/BLUE SHIELD

## 2019-08-01 ENCOUNTER — Other Ambulatory Visit: Payer: Self-pay

## 2019-08-01 ENCOUNTER — Ambulatory Visit (HOSPITAL_COMMUNITY)
Admission: RE | Admit: 2019-08-01 | Discharge: 2019-08-01 | Disposition: A | Discharge status: Home / Self Care | Payer: BC Managed Care – PPO | Source: Ambulatory Visit | Attending: Family Medicine | Admitting: Family Medicine

## 2019-08-01 DIAGNOSIS — Z1231 Encounter for screening mammogram for malignant neoplasm of breast: Secondary | ICD-10-CM | POA: Insufficient documentation

## 2019-08-11 ENCOUNTER — Ambulatory Visit (HOSPITAL_BASED_OUTPATIENT_CLINIC_OR_DEPARTMENT_OTHER)
Admission: RE | Admit: 2019-08-11 | Discharge: 2019-08-11 | Disposition: A | Discharge status: Home / Self Care | Payer: BC Managed Care – PPO | Source: Ambulatory Visit | Attending: Hematology & Oncology | Admitting: Hematology & Oncology

## 2019-08-11 ENCOUNTER — Inpatient Hospital Stay (HOSPITAL_BASED_OUTPATIENT_CLINIC_OR_DEPARTMENT_OTHER): Payer: BC Managed Care – PPO | Admitting: Hematology & Oncology

## 2019-08-11 ENCOUNTER — Inpatient Hospital Stay: Payer: BC Managed Care – PPO | Attending: Hematology & Oncology

## 2019-08-11 ENCOUNTER — Encounter: Payer: Self-pay | Admitting: Hematology & Oncology

## 2019-08-11 ENCOUNTER — Other Ambulatory Visit: Payer: Self-pay

## 2019-08-11 VITALS — BP 130/67 | HR 89 | Temp 97.0°F | Resp 18 | Wt 121.2 lb

## 2019-08-11 DIAGNOSIS — Z79899 Other long term (current) drug therapy: Secondary | ICD-10-CM | POA: Insufficient documentation

## 2019-08-11 DIAGNOSIS — Q8500 Neurofibromatosis, unspecified: Secondary | ICD-10-CM | POA: Diagnosis not present

## 2019-08-11 DIAGNOSIS — M1990 Unspecified osteoarthritis, unspecified site: Secondary | ICD-10-CM | POA: Diagnosis not present

## 2019-08-11 DIAGNOSIS — C49A0 Gastrointestinal stromal tumor, unspecified site: Secondary | ICD-10-CM | POA: Diagnosis not present

## 2019-08-11 DIAGNOSIS — C49A3 Gastrointestinal stromal tumor of small intestine: Secondary | ICD-10-CM | POA: Insufficient documentation

## 2019-08-11 DIAGNOSIS — M25552 Pain in left hip: Secondary | ICD-10-CM | POA: Insufficient documentation

## 2019-08-11 LAB — CMP (CANCER CENTER ONLY)
ALT: 15 U/L (ref 0–44)
AST: 17 U/L (ref 15–41)
Albumin: 4.2 g/dL (ref 3.5–5.0)
Alkaline Phosphatase: 57 U/L (ref 38–126)
Anion gap: 7 (ref 5–15)
BUN: 9 mg/dL (ref 8–23)
CO2: 29 mmol/L (ref 22–32)
Calcium: 8.9 mg/dL (ref 8.9–10.3)
Chloride: 106 mmol/L (ref 98–111)
Creatinine: 0.69 mg/dL (ref 0.44–1.00)
GFR, Est AFR Am: >60 mL/min (ref >60)
GFR, Estimated: >60 mL/min (ref >60)
Glucose, Bld: 128 mg/dL — ABNORMAL HIGH (ref 70–99)
Potassium: 3.7 mmol/L (ref 3.5–5.1)
Sodium: 142 mmol/L (ref 135–145)
Total Bilirubin: 0.4 mg/dL (ref 0.3–1.2)
Total Protein: 6.3 g/dL — ABNORMAL LOW (ref 6.5–8.1)

## 2019-08-11 LAB — CBC WITH DIFFERENTIAL (CANCER CENTER ONLY)
Abs Immature Granulocytes: 0.02 10*3/uL (ref 0.00–0.07)
Basophils Absolute: 0.1 10*3/uL (ref 0.0–0.1)
Basophils Relative: 1 %
Eosinophils Absolute: 0.3 10*3/uL (ref 0.0–0.5)
Eosinophils Relative: 5 %
HCT: 35.7 % — ABNORMAL LOW (ref 36.0–46.0)
Hemoglobin: 11.5 g/dL — ABNORMAL LOW (ref 12.0–15.0)
Immature Granulocytes: 0 %
Lymphocytes Relative: 21 %
Lymphs Abs: 1.1 10*3/uL (ref 0.7–4.0)
MCH: 31.2 pg (ref 26.0–34.0)
MCHC: 32.2 g/dL (ref 30.0–36.0)
MCV: 96.7 fL (ref 80.0–100.0)
Monocytes Absolute: 0.4 10*3/uL (ref 0.1–1.0)
Monocytes Relative: 7 %
Neutro Abs: 3.6 10*3/uL (ref 1.7–7.7)
Neutrophils Relative %: 66 %
Platelet Count: 197 10*3/uL (ref 150–400)
RBC: 3.69 MIL/uL — ABNORMAL LOW (ref 3.87–5.11)
RDW: 13.8 % (ref 11.5–15.5)
WBC Count: 5.5 10*3/uL (ref 4.0–10.5)
nRBC: 0 % (ref 0.0–0.2)

## 2019-08-11 LAB — LACTATE DEHYDROGENASE: LDH: 202 U/L — ABNORMAL HIGH (ref 98–192)

## 2019-08-11 NOTE — Progress Notes (Signed)
Hematology and Oncology Follow Up Visit  MONIQUEA STITELER [id number] 01-06-1956 63 y.o. 08/11/2019   Principle Diagnosis:  1.  Gastrointestinal stromal tumors  2. Neurofibromatosis  Current Therapy:   Gleevec 400 mg po q daily     Interim History:  Ms.  Supinger is back for followup.  She is still working in the retirement home.  She apparently tested positive for Covid-19 a couple weeks ago.  She would not take the vaccination.  She just does not feel confident about it.  Of note, her last CT scan that was done back on 11/26/2018 did not show any evidence of metastatic disease.  She had a large right T5 lateral meningocele.  She is complaining of pain in the left hip.  This been going on for 3-4 months.  She has not seen her family doctor about this.  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  We will going get some plain films just to check everything.  She has had no nausea or vomiting.  She has had no problems with cough.  There is been no change in bowel or bladder habits.  Overall, her performance status is ECOG 1.   Medications:  Current Outpatient Medications:  .  acetaminophen (TYLENOL) 500 MG tablet, Take 1,000 mg by mouth every 6 (six) hours as needed. For pain, Disp: , Rfl:  .  alendronate (FOSAMAX) 70 MG tablet, Take 70 mg by mouth every 7 (seven) days. Take with a full glass of water on an empty stomach., Disp: , Rfl:  .  atorvastatin (LIPITOR) 20 MG tablet, Take 20 mg by mouth daily., Disp: , Rfl:  .  CALCIUM-VITAMIN D PO, Take 1 tablet by mouth 2 (two) times daily. , Disp: , Rfl:  .  imatinib (GLEEVEC) 400 MG tablet, TAKE 1 TABLET DAILY WITH A MEAL AND LARGE GLASS OF WATER (CAUTION: CHEMOTHERAPY), Disp: 90 tablet, Rfl: 3 .  Melatonin 3 MG TBDP, Take by mouth as needed., Disp: , Rfl:  .  omeprazole (PRILOSEC) 20 MG capsule, Take 20 mg by mouth daily., Disp: , Rfl:  .  Polyvinyl Alcohol-Povidone (REFRESH OP), Place 1  drop into both eyes as needed (dry eyes). , Disp: , Rfl:  .  rosuvastatin (CRESTOR) 10 MG tablet, , Disp: , Rfl:   Allergies: No Known Allergies  Past Medical History, Surgical history, Social history, and Family History were reviewed and updated.  Review of Systems: Review of Systems  Constitutional: Negative.   HENT: Negative.   Eyes: Negative.   Respiratory: Negative.   Cardiovascular: Negative.   Gastrointestinal: Negative.   Genitourinary: Negative.   Musculoskeletal: Negative.   Skin: Negative.   Neurological: Negative.   Endo/Heme/Allergies: Negative.   Psychiatric/Behavioral: Negative.      Physical Exam:  weight is 121 lb 4 oz (55 kg). Her temporal temperature is 97 F (36.1 C) (abnormal). Her blood pressure is 130/67 and her pulse is 89. Her respiration is 18 and oxygen saturation is 100%.   Physical Exam Vitals reviewed.  HENT:     Head: Normocephalic and atraumatic.  Eyes:     Pupils: Pupils are equal, round, and reactive to light.  Cardiovascular:     Rate and Rhythm: Normal rate and regular rhythm.     Heart sounds: Normal heart sounds.  Pulmonary:     Effort: Pulmonary effort is normal.     Breath sounds: Normal breath sounds.  Abdominal:  General: Bowel sounds are normal.     Palpations: Abdomen is soft.     Comments: Abdominal exam is soft.  She has well-healed laparotomy scar.  She has no fluid wave.  There is no abdominal mass.  There is no palpable liver or spleen tip.  Musculoskeletal:        General: No tenderness or deformity. Normal range of motion.     Cervical back: Normal range of motion.  Lymphadenopathy:     Cervical: No cervical adenopathy.  Skin:    General: Skin is warm and dry.     Findings: No erythema or rash.     Comments: Skin exam shows numerous neurofibromas.  Neurological:     Mental Status: She is alert and oriented to person, place, and time.  Psychiatric:        Behavior: Behavior normal.        Thought Content:  Thought content normal.        Judgment: Judgment normal.      Lab Results  Component Value Date   WBC 5.5 08/11/2019   HGB 11.5 (L) 08/11/2019   HCT 35.7 (L) 08/11/2019   MCV 96.7 08/11/2019   PLT 197 08/11/2019     Chemistry      Component Value Date/Time   NA 142 08/11/2019 1002   NA 148 (H) 07/27/2017 0831   NA 141 12/28/2015 0955   K 3.7 08/11/2019 1002   K 3.8 07/27/2017 0831   K 4.2 12/28/2015 0955   CL 106 08/11/2019 1002   CL 109 (H) 07/27/2017 0831   CO2 29 08/11/2019 1002   CO2 29 07/27/2017 0831   CO2 27 12/28/2015 0955   BUN 9 08/11/2019 1002   BUN 10 07/27/2017 0831   BUN 12.7 12/28/2015 0955   CREATININE 0.69 08/11/2019 1002   CREATININE 0.8 07/27/2017 0831   CREATININE 0.7 12/28/2015 0955      Component Value Date/Time   CALCIUM 8.9 08/11/2019 1002   CALCIUM 9.2 07/27/2017 0831   CALCIUM 9.0 12/28/2015 0955   ALKPHOS 57 08/11/2019 1002   ALKPHOS 68 07/27/2017 0831   ALKPHOS 53 12/28/2015 0955   AST 17 08/11/2019 1002   AST 14 12/28/2015 0955   ALT 15 08/11/2019 1002   ALT 19 07/27/2017 0831   ALT 14 12/28/2015 0955   BILITOT 0.4 08/11/2019 1002   BILITOT 0.33 12/28/2015 0955         Impression and Plan: Ms. Bembry is 63 year old female with a GIST. Marland Kitchen This is metastatic. She did have resection of  tumor that was symptomatic. Her surgery was back in June 2015.   I really do not think that the pain in the left hip has anything to do with her stromal sarcoma.  I suspect it probably is more so a reflection of arthritis and degeneration.  We will go ahead and get a set of plain films to see how things look.  We will plan to get her back in 6 more months.   Volanda Napoleon, MD 12/28/202011:09 AM

## 2019-08-12 ENCOUNTER — Telehealth: Payer: Self-pay | Admitting: *Deleted

## 2019-08-12 NOTE — Telephone Encounter (Addendum)
-----   Message from Volanda Napoleon, MD sent at 08/11/2019  5:05 PM EST ----- Called patient to let her know that there is no cancer in the hip. Looks like wear and tear!!!  You may need yur primary MD to refer to orthopedic speciast

## 2019-08-26 ENCOUNTER — Other Ambulatory Visit: Payer: Self-pay | Admitting: Hematology & Oncology

## 2019-08-26 ENCOUNTER — Telehealth: Payer: Self-pay | Admitting: Pharmacy Technician

## 2019-08-26 DIAGNOSIS — C49A3 Gastrointestinal stromal tumor of small intestine: Secondary | ICD-10-CM

## 2019-08-26 NOTE — Telephone Encounter (Signed)
Oral Oncology Patient Advocate Encounter  Received message from Baxter Flattery at [facility] at [facility] that Ms Wessler had a copay issue with her [place].  I called Ms Paratore and she stated that her copay was stille $150 after using the copay card.  I called [employer] to verify this information.  I called the copay card to see if patient needed to renew or if she needed a 1 time debit card to cover remainder of her copay.    I called [employer] copay card carrier) the  representative stated that the patient could get a 1 time debit card to cover the copay, but the pharmacy must call.  I called back to [place] and spoke with Aaron Edelman.  He called to get the 1 time debit card and processed while on the phone. Patient has a $0 copay.  I conferenced Ms Singerman in on the call to set up shipment of her [place].  She will receive her next shipment on 09/02/19.  [facility] Phone [phone] Fax [phone] 08/26/2019 10:35 AM

## 2019-09-08 DIAGNOSIS — M47816 Spondylosis without myelopathy or radiculopathy, lumbar region: Secondary | ICD-10-CM | POA: Diagnosis not present

## 2019-09-08 DIAGNOSIS — Q8500 Neurofibromatosis, unspecified: Secondary | ICD-10-CM | POA: Diagnosis not present

## 2019-09-08 DIAGNOSIS — M545 Low back pain: Secondary | ICD-10-CM | POA: Diagnosis not present

## 2019-09-08 DIAGNOSIS — M810 Age-related osteoporosis without current pathological fracture: Secondary | ICD-10-CM | POA: Diagnosis not present

## 2019-09-08 DIAGNOSIS — Z6825 Body mass index (BMI) 25.0-25.9, adult: Secondary | ICD-10-CM | POA: Diagnosis not present

## 2019-09-08 DIAGNOSIS — K219 Gastro-esophageal reflux disease without esophagitis: Secondary | ICD-10-CM | POA: Diagnosis not present

## 2019-09-08 DIAGNOSIS — Z1389 Encounter for screening for other disorder: Secondary | ICD-10-CM | POA: Diagnosis not present

## 2019-09-08 DIAGNOSIS — M5417 Radiculopathy, lumbosacral region: Secondary | ICD-10-CM | POA: Diagnosis not present

## 2019-09-15 DIAGNOSIS — R42 Dizziness and giddiness: Secondary | ICD-10-CM | POA: Diagnosis not present

## 2019-09-15 DIAGNOSIS — Z6824 Body mass index (BMI) 24.0-24.9, adult: Secondary | ICD-10-CM | POA: Diagnosis not present

## 2019-09-19 DIAGNOSIS — M79605 Pain in left leg: Secondary | ICD-10-CM | POA: Diagnosis not present

## 2019-10-17 DIAGNOSIS — Z6824 Body mass index (BMI) 24.0-24.9, adult: Secondary | ICD-10-CM | POA: Diagnosis not present

## 2019-10-17 DIAGNOSIS — M546 Pain in thoracic spine: Secondary | ICD-10-CM | POA: Diagnosis not present

## 2019-11-20 DIAGNOSIS — R000 Tachycardia, unspecified: Secondary | ICD-10-CM | POA: Diagnosis not present

## 2019-11-20 DIAGNOSIS — Z0001 Encounter for general adult medical examination with abnormal findings: Secondary | ICD-10-CM | POA: Diagnosis not present

## 2019-11-20 DIAGNOSIS — F419 Anxiety disorder, unspecified: Secondary | ICD-10-CM | POA: Diagnosis not present

## 2019-11-20 DIAGNOSIS — K219 Gastro-esophageal reflux disease without esophagitis: Secondary | ICD-10-CM | POA: Diagnosis not present

## 2019-11-20 DIAGNOSIS — Z6824 Body mass index (BMI) 24.0-24.9, adult: Secondary | ICD-10-CM | POA: Diagnosis not present

## 2019-11-20 DIAGNOSIS — E7849 Other hyperlipidemia: Secondary | ICD-10-CM | POA: Diagnosis not present

## 2020-01-23 ENCOUNTER — Other Ambulatory Visit: Payer: Self-pay | Admitting: Hematology & Oncology

## 2020-01-23 DIAGNOSIS — C49A3 Gastrointestinal stromal tumor of small intestine: Secondary | ICD-10-CM

## 2020-02-09 ENCOUNTER — Inpatient Hospital Stay: Payer: BC Managed Care – PPO

## 2020-02-09 ENCOUNTER — Telehealth: Payer: Self-pay | Admitting: Family

## 2020-02-09 ENCOUNTER — Other Ambulatory Visit: Payer: Self-pay

## 2020-02-09 ENCOUNTER — Inpatient Hospital Stay: Payer: BC Managed Care – PPO | Attending: Family | Admitting: Family

## 2020-02-09 ENCOUNTER — Encounter: Payer: Self-pay | Admitting: Family

## 2020-02-09 VITALS — BP 146/61 | HR 69 | Temp 97.9°F | Wt 119.0 lb

## 2020-02-09 DIAGNOSIS — Z79899 Other long term (current) drug therapy: Secondary | ICD-10-CM | POA: Insufficient documentation

## 2020-02-09 DIAGNOSIS — C49A2 Gastrointestinal stromal tumor of stomach: Secondary | ICD-10-CM | POA: Insufficient documentation

## 2020-02-09 DIAGNOSIS — C49A3 Gastrointestinal stromal tumor of small intestine: Secondary | ICD-10-CM | POA: Diagnosis not present

## 2020-02-09 LAB — CBC WITH DIFFERENTIAL (CANCER CENTER ONLY)
Abs Immature Granulocytes: 0.03 10*3/uL (ref 0.00–0.07)
Basophils Absolute: 0.1 10*3/uL (ref 0.0–0.1)
Basophils Relative: 1 %
Eosinophils Absolute: 0.6 10*3/uL — ABNORMAL HIGH (ref 0.0–0.5)
Eosinophils Relative: 9 %
HCT: 36.7 % (ref 36.0–46.0)
Hemoglobin: 11.6 g/dL — ABNORMAL LOW (ref 12.0–15.0)
Immature Granulocytes: 1 %
Lymphocytes Relative: 20 %
Lymphs Abs: 1.3 10*3/uL (ref 0.7–4.0)
MCH: 31.7 pg (ref 26.0–34.0)
MCHC: 31.6 g/dL (ref 30.0–36.0)
MCV: 100.3 fL — ABNORMAL HIGH (ref 80.0–100.0)
Monocytes Absolute: 0.4 10*3/uL (ref 0.1–1.0)
Monocytes Relative: 6 %
Neutro Abs: 4 10*3/uL (ref 1.7–7.7)
Neutrophils Relative %: 63 %
Platelet Count: 199 10*3/uL (ref 150–400)
RBC: 3.66 MIL/uL — ABNORMAL LOW (ref 3.87–5.11)
RDW: 13 % (ref 11.5–15.5)
WBC Count: 6.4 10*3/uL (ref 4.0–10.5)
nRBC: 0 % (ref 0.0–0.2)

## 2020-02-09 LAB — CMP (CANCER CENTER ONLY)
ALT: 13 U/L (ref 0–44)
AST: 14 U/L — ABNORMAL LOW (ref 15–41)
Albumin: 4 g/dL (ref 3.5–5.0)
Alkaline Phosphatase: 46 U/L (ref 38–126)
Anion gap: 4 — ABNORMAL LOW (ref 5–15)
BUN: 10 mg/dL (ref 8–23)
CO2: 30 mmol/L (ref 22–32)
Calcium: 8.8 mg/dL — ABNORMAL LOW (ref 8.9–10.3)
Chloride: 110 mmol/L (ref 98–111)
Creatinine: 0.85 mg/dL (ref 0.44–1.00)
GFR, Est AFR Am: >60 mL/min (ref >60)
GFR, Estimated: >60 mL/min (ref >60)
Glucose, Bld: 90 mg/dL (ref 70–99)
Potassium: 4.1 mmol/L (ref 3.5–5.1)
Sodium: 144 mmol/L (ref 135–145)
Total Bilirubin: 0.3 mg/dL (ref 0.3–1.2)
Total Protein: 6.1 g/dL — ABNORMAL LOW (ref 6.5–8.1)

## 2020-02-09 NOTE — Telephone Encounter (Signed)
Appointments scheduled calendar printed & mailed per 6/28 los 

## 2020-02-09 NOTE — Progress Notes (Signed)
Hematology and Oncology Follow Up Visit  Stephanie [name] [id number] 1955-10-15 64 y.o. 02/09/2020   Principle Diagnosis:  Gastrointestinal stromal tumors  Neurofibromatosis  Current Therapy:        Gleevec 400 mg po q daily   Interim History:  Stephanie [name] is here today for follow-up. She is doing well and just had a wonderful birthday with her family.  Her only complaints at this time is hair loss which she feels may be due to the [place]. No fever, chills, n/v, cough, rash, dizziness, SOB, chest pain, palpitations, abdominal pain or changes in bowel or bladder habits.  No swelling, tenderness, numbness or tingling in her extremities at this time.  No falls or syncopal episodes to report.  She has maintained a good appetite and is staying well hydrated. Her weight is stable at this time.  No c/o pain.   ECOG Performance Status: 1 - Symptomatic but completely ambulatory  Medications:  Allergies as of 02/09/2020   No Known Allergies     Medication List       Accurate as of February 09, 2020 10:22 AM. If you have any questions, ask your nurse or doctor.        acetaminophen 500 MG tablet Commonly known as: TYLENOL Take 1,000 mg by mouth every 6 (six) hours as needed. For pain   alendronate 70 MG tablet Commonly known as: FOSAMAX Take 70 mg by mouth every 7 (seven) days. Take with a full glass of water on an empty stomach.   atorvastatin 20 MG tablet Commonly known as: LIPITOR Take 20 mg by mouth daily.   CALCIUM-VITAMIN D PO Take 1 tablet by mouth 2 (two) times daily.   Gleevec 400 MG tablet Generic drug: imatinib TAKE ONE TABLET BY MOUTH ONCE DAILY WITH MEAL AND LARGE GLASS OF WATER   Melatonin 3 MG Tbdp Take by mouth as needed.   omeprazole 20 MG capsule Commonly known as: PRILOSEC Take 20 mg by mouth daily.   REFRESH OP Place 1 drop into both eyes as needed (dry eyes).   rosuvastatin 10 MG tablet Commonly known as: CRESTOR       Allergies: No Known  Allergies  Past Medical History, Surgical history, Social history, and Family History were reviewed and updated.  Review of Systems: All other 10 point review of systems is negative.   Physical Exam:  vitals were not taken for this visit.   Wt Readings from Last 3 Encounters:  08/11/19 121 lb 4 oz (55 kg)  02/12/19 128 lb (58.1 kg)  01/31/19 126 lb (57.2 kg)    Ocular: Sclerae unicteric, pupils equal, round and reactive to light Ear-nose-throat: Oropharynx clear, dentition fair Lymphatic: No cervical or supraclavicular adenopathy Lungs no rales or rhonchi, good excursion bilaterally Heart regular rate and rhythm, no murmur appreciated Abd soft, nontender, positive bowel sounds, no liver or spleen tip palpated on exam, no fluid wave  MSK no focal spinal tenderness, no joint edema Neuro: non-focal, well-oriented, appropriate affect Breasts: Deferred   Lab Results  Component Value Date   WBC 6.4 02/09/2020   HGB 11.6 (L) 02/09/2020   HCT 36.7 02/09/2020   MCV 100.3 (H) 02/09/2020   PLT 199 02/09/2020   No results found for: FERRITIN, IRON, TIBC, UIBC, IRONPCTSAT Lab Results  Component Value Date   RBC 3.66 (L) 02/09/2020   No results found for: KPAFRELGTCHN, LAMBDASER, KAPLAMBRATIO No results found for: IGGSERUM, IGA, IGMSERUM No results found for: TOTALPROTELP, ALBUMINELP, A1GS, A2GS, BETS, BETA2SER,  [name], SPEI   Chemistry      Component Value Date/Time   NA 142 08/11/2019 1002   NA 148 (H) 07/27/2017 0831   NA 141 12/28/2015 0955   K 3.7 08/11/2019 1002   K 3.8 07/27/2017 0831   K 4.2 12/28/2015 0955   CL 106 08/11/2019 1002   CL 109 (H) 07/27/2017 0831   CO2 29 08/11/2019 1002   CO2 29 07/27/2017 0831   CO2 27 12/28/2015 0955   BUN 9 08/11/2019 1002   BUN 10 07/27/2017 0831   BUN 12.7 12/28/2015 0955   CREATININE 0.69 08/11/2019 1002   CREATININE 0.8 07/27/2017 0831   CREATININE 0.7 12/28/2015 0955      Component Value Date/Time   CALCIUM 8.9  08/11/2019 1002   CALCIUM 9.2 07/27/2017 0831   CALCIUM 9.0 12/28/2015 0955   ALKPHOS 57 08/11/2019 1002   ALKPHOS 68 07/27/2017 0831   ALKPHOS 53 12/28/2015 0955   AST 17 08/11/2019 1002   AST 14 12/28/2015 0955   ALT 15 08/11/2019 1002   ALT 19 07/27/2017 0831   ALT 14 12/28/2015 0955   BILITOT 0.4 08/11/2019 1002   BILITOT 0.33 12/28/2015 0955       Impression and Plan: Stephanie [name] is 64 yo female with metastatic GIST which she did have resected in June 2015.  She has done well on Gleevec but noted hair loss.  Lisa in pharmacy did check on side effects with Sutent and the chances of developing alopecia were the same.  I spoke with Dr. Marin Olp as well and he said we could try decreasing her dose of Gleevec in half (200 mg PO daily) but that this could increase her risk of recurrence.  After discussing all this with the patient she opted to stay on her current dose of Gleevec.  She will try taking a hair and nail vitamin and see if this helps.  We will plan to see her again in 6 months and get CT scans that same day.  She will contact our office with any questions or concerns. We can certainly see her sooner if needed.   [name], NP 6/28/202110:22 AM

## 2020-03-30 DIAGNOSIS — E663 Overweight: Secondary | ICD-10-CM | POA: Diagnosis not present

## 2020-03-30 DIAGNOSIS — M791 Myalgia, unspecified site: Secondary | ICD-10-CM | POA: Diagnosis not present

## 2020-03-30 DIAGNOSIS — M546 Pain in thoracic spine: Secondary | ICD-10-CM | POA: Diagnosis not present

## 2020-03-30 DIAGNOSIS — Z6825 Body mass index (BMI) 25.0-25.9, adult: Secondary | ICD-10-CM | POA: Diagnosis not present

## 2020-04-05 ENCOUNTER — Other Ambulatory Visit (HOSPITAL_COMMUNITY): Payer: Self-pay | Admitting: Physician Assistant

## 2020-04-05 ENCOUNTER — Other Ambulatory Visit: Payer: Self-pay

## 2020-04-05 ENCOUNTER — Ambulatory Visit (HOSPITAL_COMMUNITY)
Admission: RE | Admit: 2020-04-05 | Discharge: 2020-04-05 | Disposition: A | Discharge status: Home / Self Care | Payer: BC Managed Care – PPO | Source: Ambulatory Visit | Attending: Physician Assistant | Admitting: Physician Assistant

## 2020-04-05 DIAGNOSIS — Z6825 Body mass index (BMI) 25.0-25.9, adult: Secondary | ICD-10-CM | POA: Diagnosis not present

## 2020-04-05 DIAGNOSIS — Q059 Spina bifida, unspecified: Secondary | ICD-10-CM | POA: Diagnosis not present

## 2020-04-05 DIAGNOSIS — M546 Pain in thoracic spine: Secondary | ICD-10-CM | POA: Insufficient documentation

## 2020-04-05 DIAGNOSIS — J398 Other specified diseases of upper respiratory tract: Secondary | ICD-10-CM | POA: Diagnosis not present

## 2020-04-05 DIAGNOSIS — E663 Overweight: Secondary | ICD-10-CM | POA: Diagnosis not present

## 2020-04-05 DIAGNOSIS — M419 Scoliosis, unspecified: Secondary | ICD-10-CM | POA: Diagnosis not present

## 2020-04-05 DIAGNOSIS — M47814 Spondylosis without myelopathy or radiculopathy, thoracic region: Secondary | ICD-10-CM | POA: Diagnosis not present

## 2020-04-09 ENCOUNTER — Telehealth: Payer: Self-pay | Admitting: Pharmacy Technician

## 2020-04-09 NOTE — Telephone Encounter (Signed)
Oral Oncology Patient Advocate Encounter  Received notification from [facility] that the existing prior authorization for Stephanie [name] is due for renewal.  Renewal PA submitted on CoverMyMeds Key [id number]  Status is pending  Oral Oncology Clinic will continue to follow.  [place] [facility] [place] Phone [phone] Fax [phone] 04/09/2020 4:20 PM

## 2020-04-09 NOTE — Telephone Encounter (Signed)
Oral Oncology Patient Advocate Encounter  Prior Authorization for Stephanie [name] has been approved.    PA# [id number] Effective dates: 04/09/20 through 04/09/21  Oral Oncology Clinic will continue to follow.   [place] Patient Stephanie [name] Phone [phone] Fax [phone] 04/09/2020 4:20 PM

## 2020-04-29 DIAGNOSIS — M546 Pain in thoracic spine: Secondary | ICD-10-CM | POA: Diagnosis not present

## 2020-04-29 DIAGNOSIS — E049 Nontoxic goiter, unspecified: Secondary | ICD-10-CM | POA: Diagnosis not present

## 2020-05-03 DIAGNOSIS — L853 Xerosis cutis: Secondary | ICD-10-CM | POA: Diagnosis not present

## 2020-05-03 DIAGNOSIS — R234 Changes in skin texture: Secondary | ICD-10-CM | POA: Diagnosis not present

## 2020-05-17 DIAGNOSIS — M546 Pain in thoracic spine: Secondary | ICD-10-CM | POA: Diagnosis not present

## 2020-05-27 ENCOUNTER — Other Ambulatory Visit: Payer: Self-pay | Admitting: Hematology & Oncology

## 2020-05-27 DIAGNOSIS — C49A3 Gastrointestinal stromal tumor of small intestine: Secondary | ICD-10-CM

## 2020-06-10 DIAGNOSIS — M546 Pain in thoracic spine: Secondary | ICD-10-CM | POA: Diagnosis not present

## 2020-06-21 ENCOUNTER — Other Ambulatory Visit (HOSPITAL_COMMUNITY): Payer: Self-pay | Admitting: Family Medicine

## 2020-06-21 DIAGNOSIS — Z1231 Encounter for screening mammogram for malignant neoplasm of breast: Secondary | ICD-10-CM

## 2020-08-10 ENCOUNTER — Ambulatory Visit (HOSPITAL_BASED_OUTPATIENT_CLINIC_OR_DEPARTMENT_OTHER)
Admission: RE | Admit: 2020-08-10 | Discharge: 2020-08-10 | Disposition: A | Discharge status: Home / Self Care | Payer: BC Managed Care – PPO | Source: Ambulatory Visit | Attending: Family | Admitting: Family

## 2020-08-10 ENCOUNTER — Encounter: Payer: Self-pay | Admitting: Hematology & Oncology

## 2020-08-10 ENCOUNTER — Telehealth: Payer: Self-pay

## 2020-08-10 ENCOUNTER — Encounter (HOSPITAL_BASED_OUTPATIENT_CLINIC_OR_DEPARTMENT_OTHER): Payer: Self-pay

## 2020-08-10 ENCOUNTER — Other Ambulatory Visit: Payer: Self-pay

## 2020-08-10 ENCOUNTER — Inpatient Hospital Stay: Payer: BC Managed Care – PPO | Attending: Hematology & Oncology

## 2020-08-10 ENCOUNTER — Inpatient Hospital Stay: Payer: BC Managed Care – PPO | Admitting: Hematology & Oncology

## 2020-08-10 VITALS — BP 145/65 | HR 81 | Temp 98.1°F | Resp 18 | Wt 117.0 lb

## 2020-08-10 DIAGNOSIS — E279 Disorder of adrenal gland, unspecified: Secondary | ICD-10-CM | POA: Diagnosis not present

## 2020-08-10 DIAGNOSIS — C49A2 Gastrointestinal stromal tumor of stomach: Secondary | ICD-10-CM | POA: Diagnosis not present

## 2020-08-10 DIAGNOSIS — C49A3 Gastrointestinal stromal tumor of small intestine: Secondary | ICD-10-CM

## 2020-08-10 DIAGNOSIS — E042 Nontoxic multinodular goiter: Secondary | ICD-10-CM | POA: Diagnosis not present

## 2020-08-10 DIAGNOSIS — I700 Atherosclerosis of aorta: Secondary | ICD-10-CM | POA: Diagnosis not present

## 2020-08-10 DIAGNOSIS — Z79899 Other long term (current) drug therapy: Secondary | ICD-10-CM | POA: Insufficient documentation

## 2020-08-10 DIAGNOSIS — R918 Other nonspecific abnormal finding of lung field: Secondary | ICD-10-CM | POA: Diagnosis not present

## 2020-08-10 LAB — CBC WITH DIFFERENTIAL (CANCER CENTER ONLY)
Abs Immature Granulocytes: 0.02 10*3/uL (ref 0.00–0.07)
Basophils Absolute: 0.1 10*3/uL (ref 0.0–0.1)
Basophils Relative: 1 %
Eosinophils Absolute: 0.5 10*3/uL (ref 0.0–0.5)
Eosinophils Relative: 7 %
HCT: 37.2 % (ref 36.0–46.0)
Hemoglobin: 11.8 g/dL — ABNORMAL LOW (ref 12.0–15.0)
Immature Granulocytes: 0 %
Lymphocytes Relative: 21 %
Lymphs Abs: 1.4 10*3/uL (ref 0.7–4.0)
MCH: 31.1 pg (ref 26.0–34.0)
MCHC: 31.7 g/dL (ref 30.0–36.0)
MCV: 98.2 fL (ref 80.0–100.0)
Monocytes Absolute: 0.6 10*3/uL (ref 0.1–1.0)
Monocytes Relative: 8 %
Neutro Abs: 4.4 10*3/uL (ref 1.7–7.7)
Neutrophils Relative %: 63 %
Platelet Count: 208 10*3/uL (ref 150–400)
RBC: 3.79 MIL/uL — ABNORMAL LOW (ref 3.87–5.11)
RDW: 14 % (ref 11.5–15.5)
WBC Count: 7 10*3/uL (ref 4.0–10.5)
nRBC: 0 % (ref 0.0–0.2)

## 2020-08-10 LAB — CMP (CANCER CENTER ONLY)
ALT: 11 U/L (ref 0–44)
AST: 13 U/L — ABNORMAL LOW (ref 15–41)
Albumin: 4.2 g/dL (ref 3.5–5.0)
Alkaline Phosphatase: 53 U/L (ref 38–126)
Anion gap: 5 (ref 5–15)
BUN: 10 mg/dL (ref 8–23)
CO2: 32 mmol/L (ref 22–32)
Calcium: 9.6 mg/dL (ref 8.9–10.3)
Chloride: 105 mmol/L (ref 98–111)
Creatinine: 0.67 mg/dL (ref 0.44–1.00)
GFR, Estimated: >60 mL/min (ref >60)
Glucose, Bld: 86 mg/dL (ref 70–99)
Potassium: 4.3 mmol/L (ref 3.5–5.1)
Sodium: 142 mmol/L (ref 135–145)
Total Bilirubin: 0.3 mg/dL (ref 0.3–1.2)
Total Protein: 6.3 g/dL — ABNORMAL LOW (ref 6.5–8.1)

## 2020-08-10 LAB — LACTATE DEHYDROGENASE: LDH: 176 U/L (ref 98–192)

## 2020-08-10 MED ORDER — IOHEXOL 300 MG/ML  SOLN
100.0000 mL | Freq: Once | INTRAMUSCULAR | Status: AC | PRN
  Administered 2020-08-10: 100 mL via INTRAVENOUS

## 2020-08-10 NOTE — Progress Notes (Signed)
Hematology and Oncology Follow Up Visit  JAYDENCE MONTANO [id number] Apr 01, 1956 64 y.o. 08/10/2020   Principle Diagnosis:  1.  Gastrointestinal stromal tumors  2. Neurofibromatosis  Current Therapy:   Gleevec 400 mg po q daily     Interim History:  Ms.  Ruhe is back for followup.  The big news is that she is going to be a great grandmother.  This will happen in June.  She is very excited about this.  She went ahead and had a CT scan done today.  The CT scan did not show any evidence of recurrent GIST tumor.  She does have this cystic lesion in the lung that seems to come out from the right T5 foramen.  It measures 5.9 x 4.7 cm which is unchanged.  She also had an MRI of the thoracic spine.  This was done by orthopedic surgery.  Nothing was noted on the MRI that showed any malignancy.  She is working.  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  She has had no problems with fever.  She has had no rashes.  The neurofibromatosis has will not cause to any problems for her.  Overall, I would say her performance status is ECOG 1.     Medications:  Current Outpatient Medications:  .  CALCIUM-VITAMIN D PO, Take 1 tablet by mouth 2 (two) times daily. , Disp: , Rfl:  .  GLEEVEC 400 MG tablet, TAKE ONE TABLET BY MOUTH ONCE DAILY WITH MEAL AND LARGE GLASS OF WATER, Disp: 30 tablet, Rfl: 2 .  Melatonin 3 MG TBDP, Take by mouth as needed., Disp: , Rfl:  .  omeprazole (PRILOSEC) 20 MG capsule, Take 20 mg by mouth daily., Disp: , Rfl:  .  Polyvinyl Alcohol-Povidone (REFRESH OP), Place 1 drop into both eyes as needed (dry eyes). , Disp: , Rfl:  .  rosuvastatin (CRESTOR) 10 MG tablet, , Disp: , Rfl:  .  urea (CARMOL) 20 % cream, urea 20 % topical cream  APPLY 1 APPLICATION TWICE A DAY FOR 30 DAYS, Disp: , Rfl:  .  acetaminophen (TYLENOL) 500 MG tablet, Take 1,000 mg by mouth every 6 (six) hours as needed. For pain (Patient not taking:  Reported on 08/10/2020), Disp: , Rfl:  .  cyclobenzaprine (FLEXERIL) 10 MG tablet, Take 10 mg by mouth every 8 (eight) hours as needed. (Patient not taking: Reported on 08/10/2020), Disp: , Rfl:  .  HYDROcodone-acetaminophen (NORCO/VICODIN) 5-325 MG tablet, Take 1-2 tablets by mouth 4 (four) times daily as needed. (Patient not taking: Reported on 08/10/2020), Disp: , Rfl:  .  meclizine (ANTIVERT) 25 MG tablet, Take 25 mg by mouth every 6 (six) hours as needed. (Patient not taking: Reported on 08/10/2020), Disp: , Rfl:  .  naproxen (NAPROSYN) 375 MG tablet, naproxen 375 mg tablet  TAKE 1 TABLET BY MOUTH TWICE A DAY WITH FOOD (Patient not taking: Reported on 08/10/2020), Disp: , Rfl:  .  traMADol (ULTRAM) 50 MG tablet, tramadol 50 mg tablet  TAKE 1 TABLET BY MOUTH 3 TIMES A DAY AS NEEDED (Patient not taking: Reported on 08/10/2020), Disp: , Rfl:   Allergies: No Known Allergies  Past Medical History, Surgical history, Social history, and Family History were reviewed and updated.  Review of Systems: Review of Systems  Constitutional: Negative.   HENT: Negative.   Eyes: Negative.   Respiratory: Negative.   Cardiovascular: Negative.   Gastrointestinal: Negative.  Genitourinary: Negative.   Musculoskeletal: Negative.   Skin: Negative.   Neurological: Negative.   Endo/Heme/Allergies: Negative.   Psychiatric/Behavioral: Negative.      Physical Exam:  weight is 117 lb (53.1 kg). Her oral temperature is 98.1 F (36.7 C). Her blood pressure is 145/65 (abnormal) and her pulse is 81. Her respiration is 18 and oxygen saturation is 100%.   Physical Exam Vitals reviewed.  HENT:     Head: Normocephalic and atraumatic.  Eyes:     Pupils: Pupils are equal, round, and reactive to light.  Cardiovascular:     Rate and Rhythm: Normal rate and regular rhythm.     Heart sounds: Normal heart sounds.  Pulmonary:     Effort: Pulmonary effort is normal.     Breath sounds: Normal breath sounds.   Abdominal:     General: Bowel sounds are normal.     Palpations: Abdomen is soft.     Comments: Abdominal exam is soft.  She has well-healed laparotomy scar.  She has no fluid wave.  There is no abdominal mass.  There is no palpable liver or spleen tip.  Musculoskeletal:        General: No tenderness or deformity. Normal range of motion.     Cervical back: Normal range of motion.  Lymphadenopathy:     Cervical: No cervical adenopathy.  Skin:    General: Skin is warm and dry.     Findings: No erythema or rash.     Comments: Skin exam shows numerous neurofibromas.  Neurological:     Mental Status: She is alert and oriented to person, place, and time.  Psychiatric:        Behavior: Behavior normal.        Thought Content: Thought content normal.        Judgment: Judgment normal.      Lab Results  Component Value Date   WBC 7.0 08/10/2020   HGB 11.8 (L) 08/10/2020   HCT 37.2 08/10/2020   MCV 98.2 08/10/2020   PLT 208 08/10/2020     Chemistry      Component Value Date/Time   NA 142 08/10/2020 0949   NA 148 (H) 07/27/2017 0831   NA 141 12/28/2015 0955   K 4.3 08/10/2020 0949   K 3.8 07/27/2017 0831   K 4.2 12/28/2015 0955   CL 105 08/10/2020 0949   CL 109 (H) 07/27/2017 0831   CO2 32 08/10/2020 0949   CO2 29 07/27/2017 0831   CO2 27 12/28/2015 0955   BUN 10 08/10/2020 0949   BUN 10 07/27/2017 0831   BUN 12.7 12/28/2015 0955   CREATININE 0.67 08/10/2020 0949   CREATININE 0.8 07/27/2017 0831   CREATININE 0.7 12/28/2015 0955      Component Value Date/Time   CALCIUM 9.6 08/10/2020 0949   CALCIUM 9.2 07/27/2017 0831   CALCIUM 9.0 12/28/2015 0955   ALKPHOS 53 08/10/2020 0949   ALKPHOS 68 07/27/2017 0831   ALKPHOS 53 12/28/2015 0955   AST 13 (L) 08/10/2020 0949   AST 14 12/28/2015 0955   ALT 11 08/10/2020 0949   ALT 19 07/27/2017 0831   ALT 14 12/28/2015 0955   BILITOT 0.3 08/10/2020 0949   BILITOT 0.33 12/28/2015 0955      Impression and Plan: Ms. Chiaramonte  is 64 year old female with a GIST. Marland Kitchen This is metastatic. She did have resection of  tumor that was symptomatic. Her surgery was back in June 2015.   I am happy that nothing  really shows [place] that she has progression of the GIST.  I am just very impressed that the Gleevec has done so well for her.  I think that we can probably go ahead and get her back in 8 months now.  At some point, we hopefully can go yearly.     Josph Macho, MD 12/28/202112:08 PM

## 2020-08-10 NOTE — Telephone Encounter (Signed)
appts made and pritned for pt per 08/10/20 los...Stephanie [name]

## 2020-08-30 ENCOUNTER — Ambulatory Visit (HOSPITAL_COMMUNITY)
Admission: RE | Admit: 2020-08-30 | Discharge: 2020-08-30 | Disposition: A | Discharge status: Home / Self Care | Payer: BC Managed Care – PPO | Source: Ambulatory Visit | Attending: Family Medicine | Admitting: Family Medicine

## 2020-08-30 DIAGNOSIS — Z1231 Encounter for screening mammogram for malignant neoplasm of breast: Secondary | ICD-10-CM | POA: Diagnosis not present

## 2020-10-04 ENCOUNTER — Other Ambulatory Visit: Payer: Self-pay | Admitting: Hematology & Oncology

## 2020-10-04 DIAGNOSIS — C49A3 Gastrointestinal stromal tumor of small intestine: Secondary | ICD-10-CM

## 2020-11-29 DIAGNOSIS — Q8500 Neurofibromatosis, unspecified: Secondary | ICD-10-CM | POA: Diagnosis not present

## 2020-11-29 DIAGNOSIS — Z1331 Encounter for screening for depression: Secondary | ICD-10-CM | POA: Diagnosis not present

## 2020-11-29 DIAGNOSIS — Z6824 Body mass index (BMI) 24.0-24.9, adult: Secondary | ICD-10-CM | POA: Diagnosis not present

## 2020-11-29 DIAGNOSIS — Z0001 Encounter for general adult medical examination with abnormal findings: Secondary | ICD-10-CM | POA: Diagnosis not present

## 2020-11-29 DIAGNOSIS — D490 Neoplasm of unspecified behavior of digestive system: Secondary | ICD-10-CM | POA: Diagnosis not present

## 2020-11-29 DIAGNOSIS — M810 Age-related osteoporosis without current pathological fracture: Secondary | ICD-10-CM | POA: Diagnosis not present

## 2020-11-29 DIAGNOSIS — E7849 Other hyperlipidemia: Secondary | ICD-10-CM | POA: Diagnosis not present

## 2020-11-29 DIAGNOSIS — E559 Vitamin D deficiency, unspecified: Secondary | ICD-10-CM | POA: Diagnosis not present

## 2020-11-29 DIAGNOSIS — Z23 Encounter for immunization: Secondary | ICD-10-CM | POA: Diagnosis not present

## 2020-11-29 DIAGNOSIS — K219 Gastro-esophageal reflux disease without esophagitis: Secondary | ICD-10-CM | POA: Diagnosis not present

## 2020-11-29 DIAGNOSIS — Z1389 Encounter for screening for other disorder: Secondary | ICD-10-CM | POA: Diagnosis not present

## 2020-11-29 DIAGNOSIS — H6123 Impacted cerumen, bilateral: Secondary | ICD-10-CM | POA: Diagnosis not present

## 2020-12-06 DIAGNOSIS — Z6824 Body mass index (BMI) 24.0-24.9, adult: Secondary | ICD-10-CM | POA: Diagnosis not present

## 2020-12-06 DIAGNOSIS — J302 Other seasonal allergic rhinitis: Secondary | ICD-10-CM | POA: Diagnosis not present

## 2021-01-27 DIAGNOSIS — H43391 Other vitreous opacities, right eye: Secondary | ICD-10-CM | POA: Diagnosis not present

## 2021-01-27 DIAGNOSIS — H35461 Secondary vitreoretinal degeneration, right eye: Secondary | ICD-10-CM | POA: Diagnosis not present

## 2021-01-27 DIAGNOSIS — H43811 Vitreous degeneration, right eye: Secondary | ICD-10-CM | POA: Diagnosis not present

## 2021-02-10 ENCOUNTER — Other Ambulatory Visit: Payer: Self-pay | Admitting: Hematology & Oncology

## 2021-02-10 DIAGNOSIS — C49A3 Gastrointestinal stromal tumor of small intestine: Secondary | ICD-10-CM

## 2021-02-26 IMAGING — MG DIGITAL SCREENING BILAT W/ TOMO W/ CAD
8 series · 9 of 24 positions shown · non-contrast
Comparison: Previous exam(s).

CLINICAL DATA: Screening.

EXAM:
DIGITAL SCREENING BILATERAL MAMMOGRAM WITH TOMO AND CAD

[L CC synth-2D]
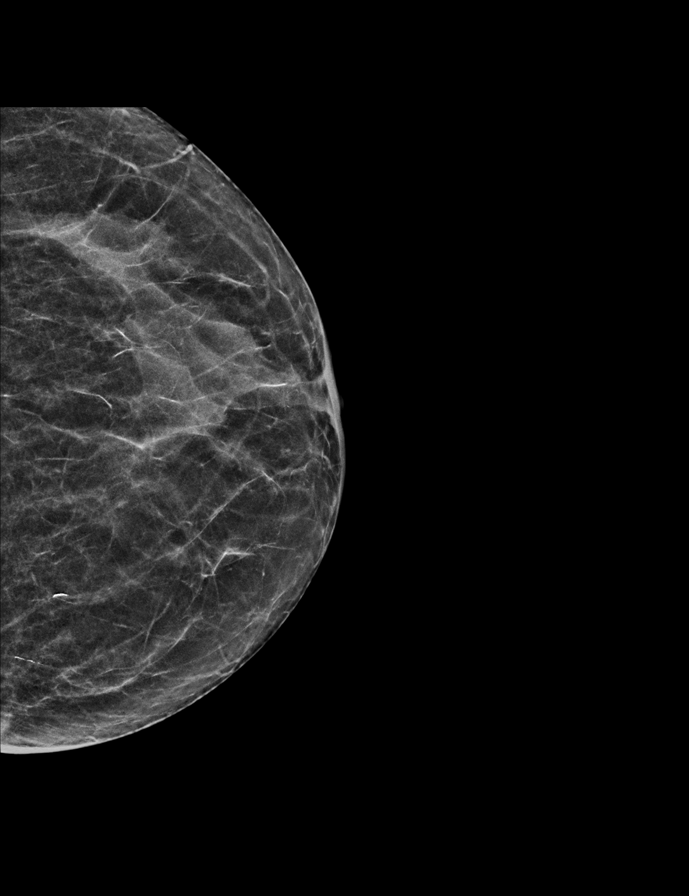

[R CC synth-2D]
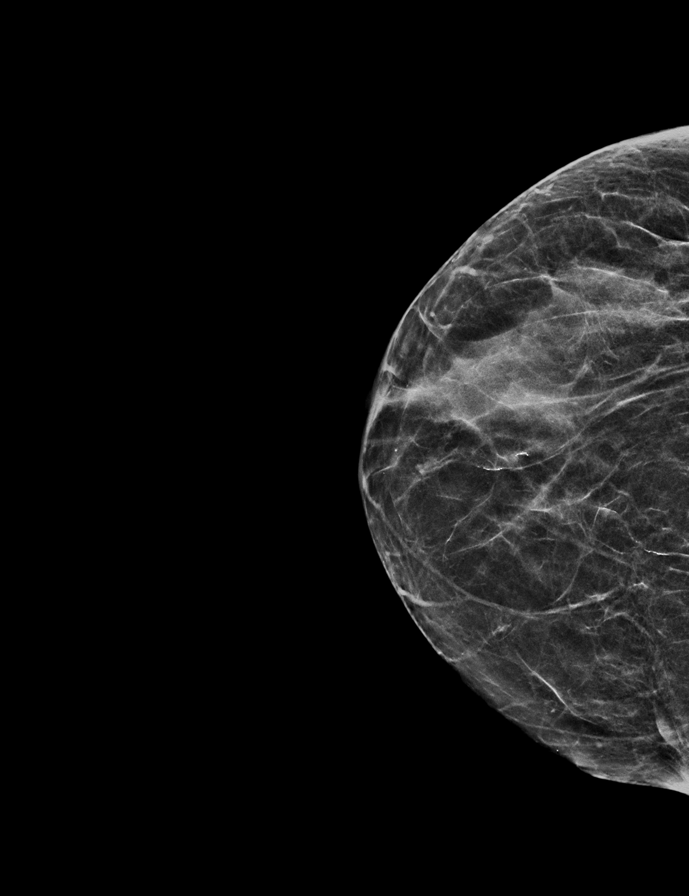

[R MLO synth-2D]
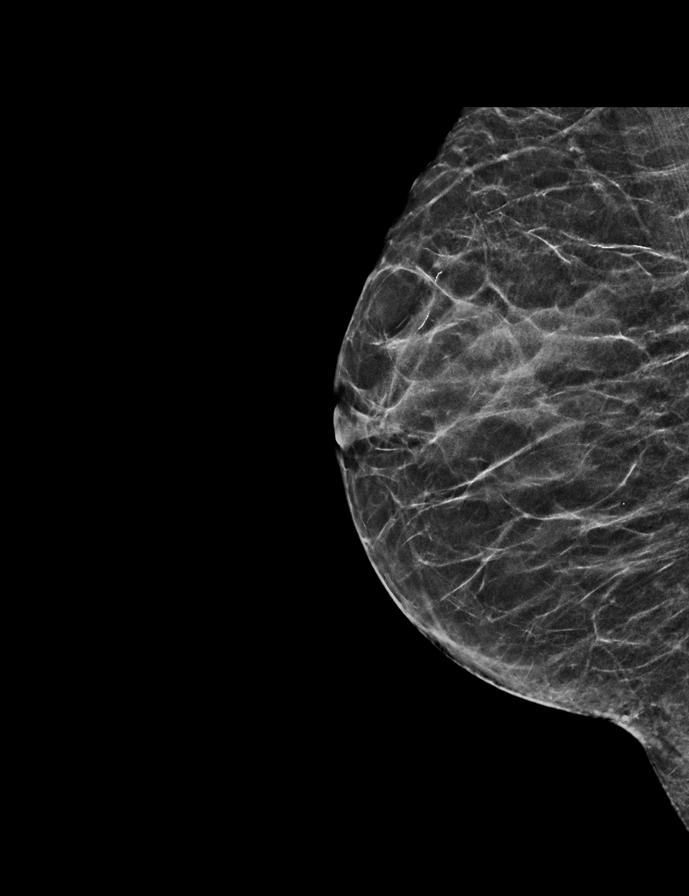

[L MLO synth-2D]
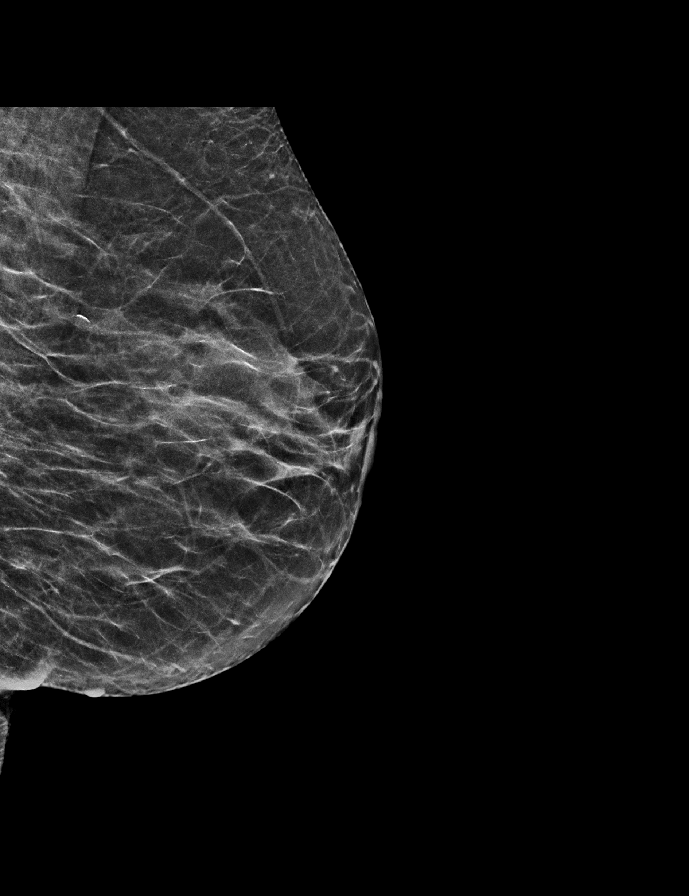

[L CC tomo · 2 of 44 frames shown]
[frame 15/44]
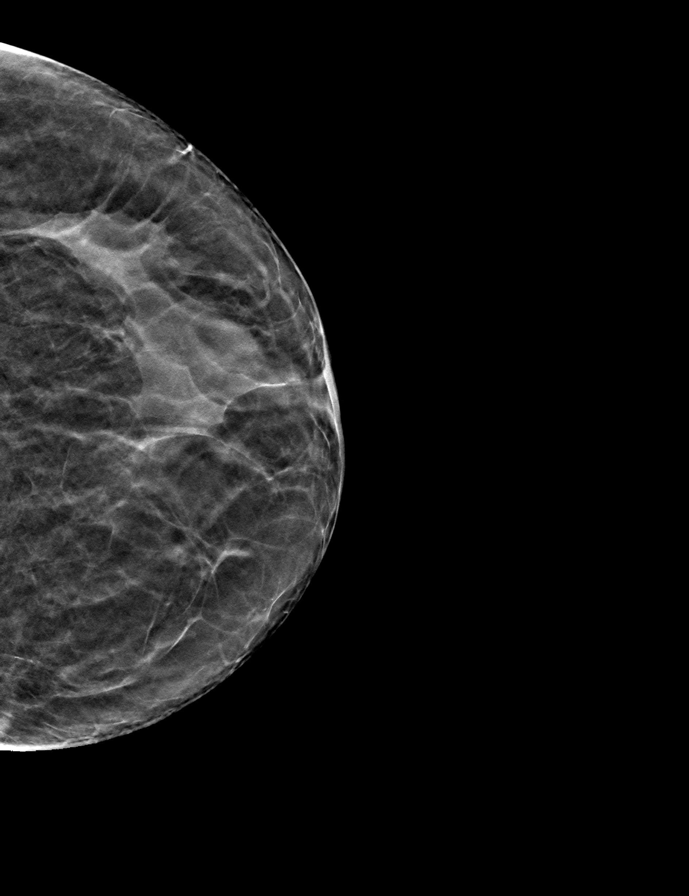
[frame 23/44]
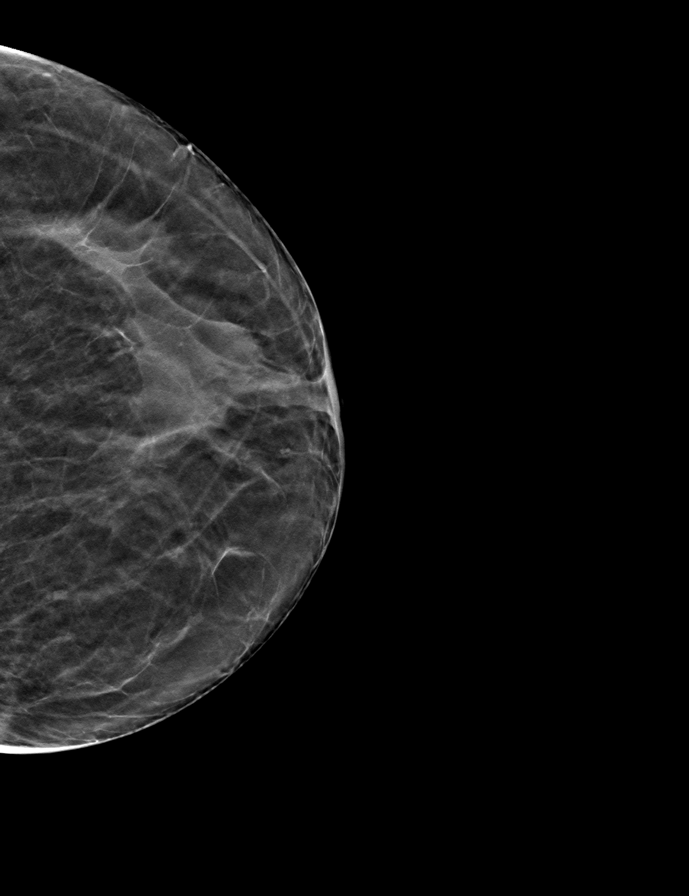

[R CC tomo · tomo slice 23/44.0]
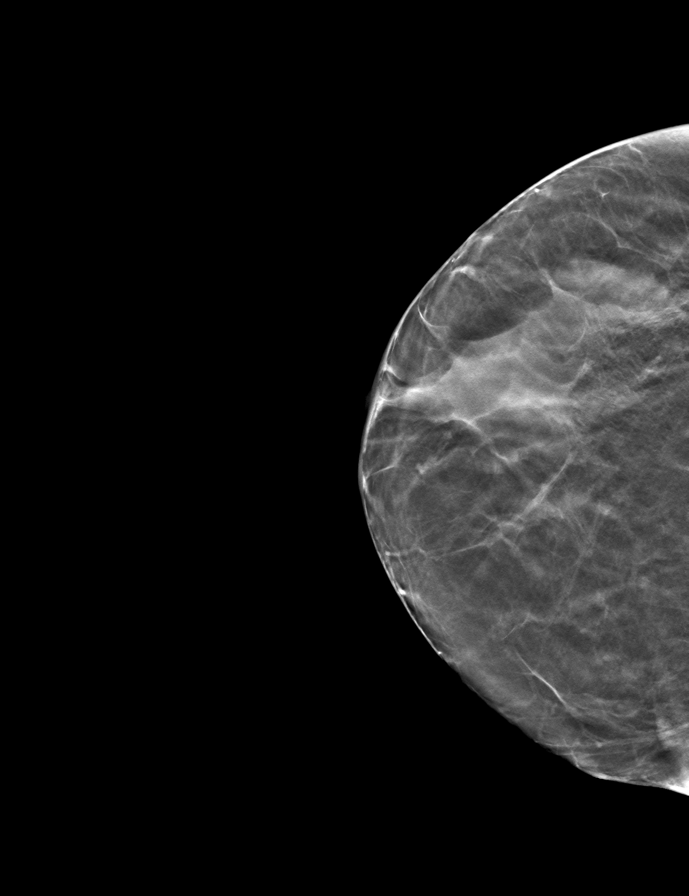

[L MLO tomo · tomo slice 23/46.0]
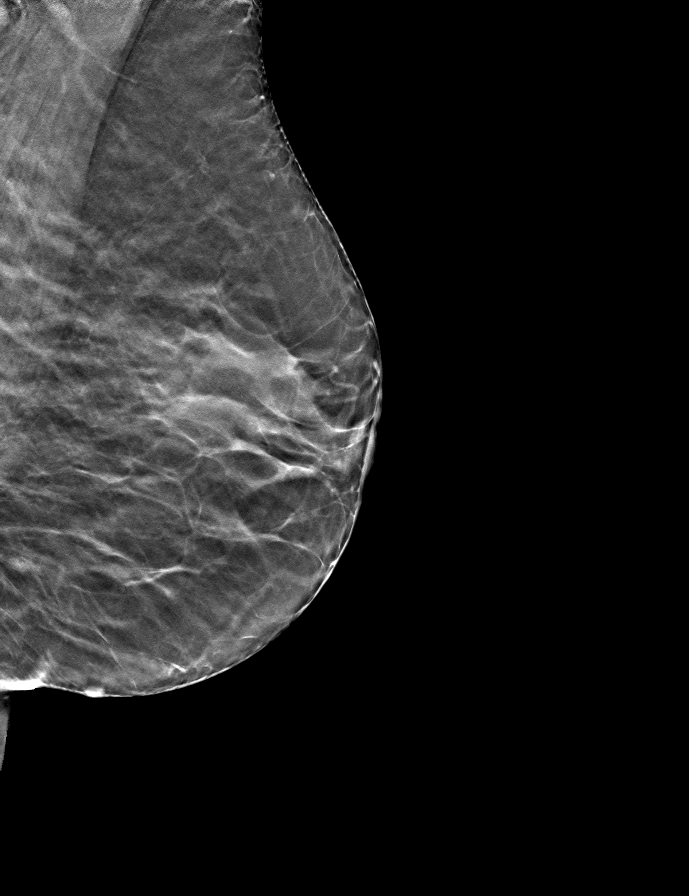

[R MLO tomo · tomo slice 21/42.0]
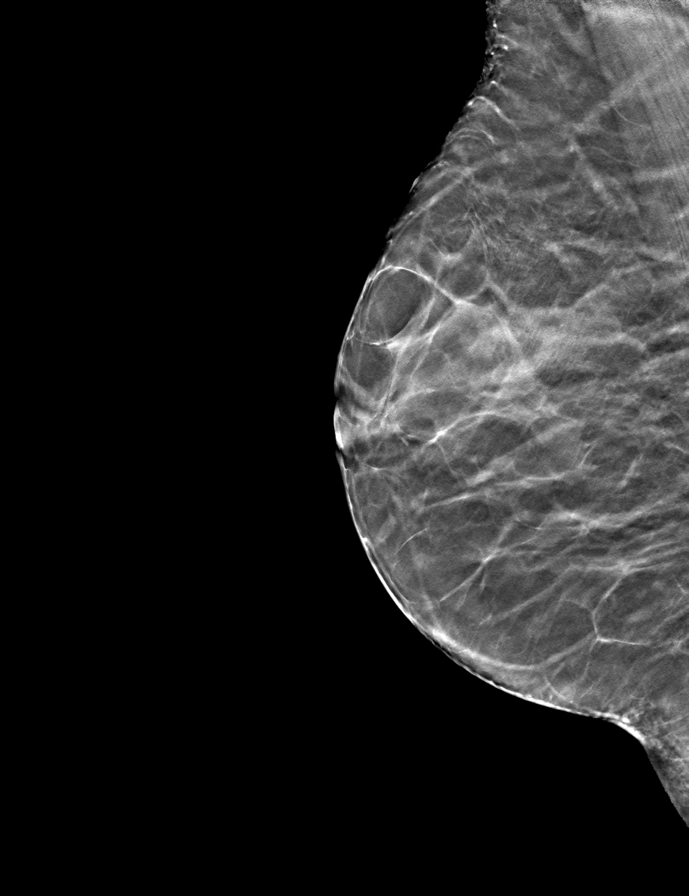

[9 of 24 positions shown; findings below may reference images not displayed]

ACR Breast Density Category b: There are scattered areas of
fibroglandular density.
FINDINGS: There are no findings suspicious for malignancy. Images were
processed with CAD.
IMPRESSION: No mammographic evidence of malignancy. A result letter of this
screening mammogram will be mailed directly to the patient.

RECOMMENDATION:
Screening mammogram in one year. (Code:CN-[id number])

BI-RADS CATEGORY  1: Negative.

## 2021-03-21 ENCOUNTER — Telehealth: Payer: Self-pay | Admitting: *Deleted

## 2021-03-21 ENCOUNTER — Inpatient Hospital Stay (HOSPITAL_BASED_OUTPATIENT_CLINIC_OR_DEPARTMENT_OTHER): Payer: BC Managed Care – PPO | Admitting: Hematology & Oncology

## 2021-03-21 ENCOUNTER — Ambulatory Visit (HOSPITAL_BASED_OUTPATIENT_CLINIC_OR_DEPARTMENT_OTHER)
Admission: RE | Admit: 2021-03-21 | Discharge: 2021-03-21 | Disposition: A | Discharge status: Home / Self Care | Payer: BC Managed Care – PPO | Source: Ambulatory Visit | Attending: Hematology & Oncology | Admitting: Hematology & Oncology

## 2021-03-21 ENCOUNTER — Other Ambulatory Visit: Payer: Self-pay

## 2021-03-21 ENCOUNTER — Inpatient Hospital Stay: Payer: BC Managed Care – PPO | Attending: Hematology & Oncology

## 2021-03-21 DIAGNOSIS — C49A0 Gastrointestinal stromal tumor, unspecified site: Secondary | ICD-10-CM | POA: Diagnosis not present

## 2021-03-21 DIAGNOSIS — Q8500 Neurofibromatosis, unspecified: Secondary | ICD-10-CM | POA: Insufficient documentation

## 2021-03-21 DIAGNOSIS — I2510 Atherosclerotic heart disease of native coronary artery without angina pectoris: Secondary | ICD-10-CM | POA: Diagnosis not present

## 2021-03-21 DIAGNOSIS — C49A3 Gastrointestinal stromal tumor of small intestine: Secondary | ICD-10-CM | POA: Insufficient documentation

## 2021-03-21 DIAGNOSIS — E279 Disorder of adrenal gland, unspecified: Secondary | ICD-10-CM | POA: Diagnosis not present

## 2021-03-21 DIAGNOSIS — Q059 Spina bifida, unspecified: Secondary | ICD-10-CM | POA: Diagnosis not present

## 2021-03-21 DIAGNOSIS — L989 Disorder of the skin and subcutaneous tissue, unspecified: Secondary | ICD-10-CM | POA: Diagnosis not present

## 2021-03-21 DIAGNOSIS — J9811 Atelectasis: Secondary | ICD-10-CM | POA: Diagnosis not present

## 2021-03-21 LAB — CMP (CANCER CENTER ONLY)
ALT: 12 U/L (ref 0–44)
AST: 15 U/L (ref 15–41)
Albumin: 4.1 g/dL (ref 3.5–5.0)
Alkaline Phosphatase: 55 U/L (ref 38–126)
Anion gap: 7 (ref 5–15)
BUN: 11 mg/dL (ref 8–23)
CO2: 30 mmol/L (ref 22–32)
Calcium: 9.3 mg/dL (ref 8.9–10.3)
Chloride: 107 mmol/L (ref 98–111)
Creatinine: 0.71 mg/dL (ref 0.44–1.00)
GFR, Estimated: >60 mL/min (ref >60)
Glucose, Bld: 89 mg/dL (ref 70–99)
Potassium: 4.9 mmol/L (ref 3.5–5.1)
Sodium: 144 mmol/L (ref 135–145)
Total Bilirubin: 0.4 mg/dL (ref 0.3–1.2)
Total Protein: 6 g/dL — ABNORMAL LOW (ref 6.5–8.1)

## 2021-03-21 LAB — CBC WITH DIFFERENTIAL (CANCER CENTER ONLY)
Abs Immature Granulocytes: 0.02 10*3/uL (ref 0.00–0.07)
Basophils Absolute: 0.1 10*3/uL (ref 0.0–0.1)
Basophils Relative: 1 %
Eosinophils Absolute: 0.4 10*3/uL (ref 0.0–0.5)
Eosinophils Relative: 7 %
HCT: 37.1 % (ref 36.0–46.0)
Hemoglobin: 12 g/dL (ref 12.0–15.0)
Immature Granulocytes: 0 %
Lymphocytes Relative: 24 %
Lymphs Abs: 1.5 10*3/uL (ref 0.7–4.0)
MCH: 31.3 pg (ref 26.0–34.0)
MCHC: 32.3 g/dL (ref 30.0–36.0)
MCV: 96.6 fL (ref 80.0–100.0)
Monocytes Absolute: 0.4 10*3/uL (ref 0.1–1.0)
Monocytes Relative: 6 %
Neutro Abs: 3.9 10*3/uL (ref 1.7–7.7)
Neutrophils Relative %: 62 %
Platelet Count: 188 10*3/uL (ref 150–400)
RBC: 3.84 MIL/uL — ABNORMAL LOW (ref 3.87–5.11)
RDW: 14 % (ref 11.5–15.5)
WBC Count: 6.3 10*3/uL (ref 4.0–10.5)
nRBC: 0 % (ref 0.0–0.2)

## 2021-03-21 LAB — LACTATE DEHYDROGENASE: LDH: 174 U/L (ref 98–192)

## 2021-03-21 MED ORDER — IOHEXOL 300 MG/ML  SOLN
75.0000 mL | Freq: Once | INTRAMUSCULAR | Status: AC | PRN
  Administered 2021-03-21: 75 mL via INTRAVENOUS

## 2021-03-21 NOTE — Progress Notes (Signed)
Hematology and Oncology Follow Up Visit  TAE PITCAIRN [id number] May 23, 1956 65 y.o. 03/21/2021   Principle Diagnosis:  1.  Gastrointestinal stromal tumors  2. Neurofibromatosis  Current Therapy:   Gleevec 400 mg po q daily     Interim History:  Ms.  Dishner is back for followup.  The big news is that she is  a great grandmother.  She had a great grandson born in June.  I saw a picture of him.  He is very cute.  I am so happy for her.  She had a CT scan done today.  The CT scan, not expectantly, showed that there was no evidence of recurrence of the GIST.  She has had no abdominal pain.  She has had no cough or shortness of breath.  She is still working.  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  She has had no headache.  She does have neurofibromatosis.  She has had no issues with this.  Overall, I would say performance status is ECOG 1.     Medications:  Current Outpatient Medications:    acetaminophen (TYLENOL) 500 MG tablet, Take 1,000 mg by mouth every 6 (six) hours as needed. For pain (Patient not taking: Reported on 08/10/2020), Disp: , Rfl:    CALCIUM-VITAMIN D PO, Take 1 tablet by mouth 2 (two) times daily. , Disp: , Rfl:    cyclobenzaprine (FLEXERIL) 10 MG tablet, Take 10 mg by mouth every 8 (eight) hours as needed. (Patient not taking: Reported on 08/10/2020), Disp: , Rfl:    GLEEVEC 400 MG tablet, TAKE 1 TABLET BY MOUTH ONCE DAILY AT THE SAME TIME WITH FOOD AND A LARGE GLASS OF WATER. DO NOT CRUSH TABLETS. AVOID GRAPEFRUIT PRODUCTS., Disp: 30 tablet, Rfl: 2   HYDROcodone-acetaminophen (NORCO/VICODIN) 5-325 MG tablet, Take 1-2 tablets by mouth 4 (four) times daily as needed. (Patient not taking: Reported on 08/10/2020), Disp: , Rfl:    meclizine (ANTIVERT) 25 MG tablet, Take 25 mg by mouth every 6 (six) hours as needed. (Patient not taking: Reported on 08/10/2020), Disp: , Rfl:    Melatonin 3 MG  TBDP, Take by mouth as needed., Disp: , Rfl:    naproxen (NAPROSYN) 375 MG tablet, naproxen 375 mg tablet  TAKE 1 TABLET BY MOUTH TWICE A DAY WITH FOOD (Patient not taking: Reported on 08/10/2020), Disp: , Rfl:    omeprazole (PRILOSEC) 20 MG capsule, Take 20 mg by mouth daily., Disp: , Rfl:    Polyvinyl Alcohol-Povidone (REFRESH OP), Place 1 drop into both eyes as needed (dry eyes). , Disp: , Rfl:    rosuvastatin (CRESTOR) 10 MG tablet, , Disp: , Rfl:    traMADol (ULTRAM) 50 MG tablet, tramadol 50 mg tablet  TAKE 1 TABLET BY MOUTH 3 TIMES A DAY AS NEEDED (Patient not taking: Reported on 08/10/2020), Disp: , Rfl:    urea (CARMOL) 20 % cream, urea 20 % topical cream  APPLY 1 APPLICATION TWICE A DAY FOR 30 DAYS, Disp: , Rfl:   Allergies: No Known Allergies  Past Medical History, Surgical history, Social history, and Family History were reviewed and updated.  Review of Systems: Review of Systems  Constitutional: Negative.   HENT: Negative.    Eyes: Negative.   Respiratory: Negative.    Cardiovascular: Negative.   Gastrointestinal: Negative.   Genitourinary: Negative.   Musculoskeletal: Negative.   Skin: Negative.   Neurological: Negative.  Endo/Heme/Allergies: Negative.   Psychiatric/Behavioral: Negative.      Physical Exam:  vitals were not taken for this visit.   Physical Exam Vitals reviewed.  HENT:     Head: Normocephalic and atraumatic.  Eyes:     Pupils: Pupils are equal, round, and reactive to light.  Cardiovascular:     Rate and Rhythm: Normal rate and regular rhythm.     Heart sounds: Normal heart sounds.  Pulmonary:     Effort: Pulmonary effort is normal.     Breath sounds: Normal breath sounds.  Abdominal:     General: Bowel sounds are normal.     Palpations: Abdomen is soft.     Comments: Abdominal exam is soft.  She has well-healed laparotomy scar.  She has no fluid wave.  There is no abdominal mass.  There is no palpable liver or spleen tip.  Musculoskeletal:         General: No tenderness or deformity. Normal range of motion.     Cervical back: Normal range of motion.  Lymphadenopathy:     Cervical: No cervical adenopathy.  Skin:    General: Skin is warm and dry.     Findings: No erythema or rash.     Comments: Skin exam shows numerous neurofibromas.  Neurological:     Mental Status: She is alert and oriented to person, place, and time.  Psychiatric:        Behavior: Behavior normal.        Thought Content: Thought content normal.        Judgment: Judgment normal.     Lab Results  Component Value Date   WBC 6.3 03/21/2021   HGB 12.0 03/21/2021   HCT 37.1 03/21/2021   MCV 96.6 03/21/2021   PLT 188 03/21/2021     Chemistry      Component Value Date/Time   NA 144 03/21/2021 0803   NA 148 (H) 07/27/2017 0831   NA 141 12/28/2015 0955   K 4.9 03/21/2021 0803   K 3.8 07/27/2017 0831   K 4.2 12/28/2015 0955   CL 107 03/21/2021 0803   CL 109 (H) 07/27/2017 0831   CO2 30 03/21/2021 0803   CO2 29 07/27/2017 0831   CO2 27 12/28/2015 0955   BUN 11 03/21/2021 0803   BUN 10 07/27/2017 0831   BUN 12.7 12/28/2015 0955   CREATININE 0.71 03/21/2021 0803   CREATININE 0.8 07/27/2017 0831   CREATININE 0.7 12/28/2015 0955      Component Value Date/Time   CALCIUM 9.3 03/21/2021 0803   CALCIUM 9.2 07/27/2017 0831   CALCIUM 9.0 12/28/2015 0955   ALKPHOS 55 03/21/2021 0803   ALKPHOS 68 07/27/2017 0831   ALKPHOS 53 12/28/2015 0955   AST 15 03/21/2021 0803   AST 14 12/28/2015 0955   ALT 12 03/21/2021 0803   ALT 19 07/27/2017 0831   ALT 14 12/28/2015 0955   BILITOT 0.4 03/21/2021 0803   BILITOT 0.33 12/28/2015 0955      Impression and Plan: Ms. Curti is 65 year old female with a GIST. Marland Kitchen This is metastatic. She did have resection of  tumor that was symptomatic. Her surgery was back in June 2015.   I am happy that nothing really shows [place] that she has progression of the GIST.  She is doing well with Gleevec.  She is tolerating Gleevec  well.  It is worked quite nicely.  Again I am so happy that she is now a great grandmother.  I know this  is a huge highlight for her.  I am glad that she can enjoy it.  I think we get her back in 7-8 months.  We will get her through the holidays and try to get her through most of winter.      Volanda Napoleon, MD 8/8/202211:30 AM

## 2021-03-21 NOTE — Telephone Encounter (Signed)
Per 03/21/21 los gave upcoming appointments - confirmed

## 2021-03-22 ENCOUNTER — Encounter: Payer: Self-pay | Admitting: *Deleted

## 2021-04-21 ENCOUNTER — Encounter: Payer: Self-pay | Admitting: Internal Medicine

## 2021-06-13 ENCOUNTER — Other Ambulatory Visit: Payer: Self-pay | Admitting: Hematology & Oncology

## 2021-06-13 DIAGNOSIS — C49A3 Gastrointestinal stromal tumor of small intestine: Secondary | ICD-10-CM

## 2021-06-22 ENCOUNTER — Other Ambulatory Visit (HOSPITAL_COMMUNITY): Payer: Self-pay | Admitting: Family Medicine

## 2021-06-22 DIAGNOSIS — Z1231 Encounter for screening mammogram for malignant neoplasm of breast: Secondary | ICD-10-CM
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
## 2021-09-01 ENCOUNTER — Telehealth: Payer: Self-pay

## 2021-09-01 ENCOUNTER — Ambulatory Visit (HOSPITAL_COMMUNITY): Payer: BC Managed Care – PPO

## 2021-09-01 ENCOUNTER — Other Ambulatory Visit: Payer: Self-pay

## 2021-09-01 ENCOUNTER — Encounter: Payer: Self-pay | Admitting: Gastroenterology

## 2021-09-01 ENCOUNTER — Ambulatory Visit (INDEPENDENT_AMBULATORY_CARE_PROVIDER_SITE_OTHER): Payer: BC Managed Care – PPO | Admitting: Gastroenterology

## 2021-09-01 VITALS — BP 161/75 | HR 84 | Temp 97.1°F | Ht <= 58 in | Wt 119.0 lb

## 2021-09-01 DIAGNOSIS — Z1211 Encounter for screening for malignant neoplasm of colon: Secondary | ICD-10-CM | POA: Diagnosis not present

## 2021-09-01 MED ORDER — PEG 3350-KCL-NA BICARB-NACL 420 G PO SOLR: 4000.0000 mL | ORAL | 0 refills | Status: DC

## 2021-09-01 NOTE — Telephone Encounter (Signed)
PA for TCS submitted via [employer] website. Order ID: [id number], valid 09/01/21-10/30/21.

## 2021-09-01 NOTE — Patient Instructions (Signed)
We will arrange for you to have a colonoscopy in the near future with Dr. Abbey Chatters.  We will follow-up with you in the office as needed.  Do not hesitate to call if you have any new GI concerns.  It was a pleasure meeting you today!  Aliene Altes, PA-C [facility]

## 2021-09-05 ENCOUNTER — Ambulatory Visit (HOSPITAL_COMMUNITY)
Admission: RE | Admit: 2021-09-05 | Discharge: 2021-09-05 | Disposition: A | Discharge status: Home / Self Care | Payer: BC Managed Care – PPO | Source: Ambulatory Visit | Attending: Family Medicine | Admitting: Family Medicine

## 2021-09-05 ENCOUNTER — Other Ambulatory Visit: Payer: Self-pay

## 2021-09-05 DIAGNOSIS — Z1231 Encounter for screening mammogram for malignant neoplasm of breast: Secondary | ICD-10-CM | POA: Diagnosis not present

## 2021-09-23 ENCOUNTER — Ambulatory Visit (HOSPITAL_COMMUNITY)
Admission: RE | Admit: 2021-09-23 | Discharge: 2021-09-23 | Disposition: A | Discharge status: Home / Self Care | Payer: BC Managed Care – PPO | Attending: Internal Medicine | Admitting: Internal Medicine

## 2021-09-23 ENCOUNTER — Other Ambulatory Visit: Payer: Self-pay

## 2021-09-23 ENCOUNTER — Encounter (HOSPITAL_COMMUNITY): Payer: Self-pay

## 2021-09-23 ENCOUNTER — Encounter (HOSPITAL_COMMUNITY)
Admission: RE | Disposition: A | Discharge status: Home / Self Care | Payer: Self-pay | Source: Home / Self Care | Attending: Internal Medicine

## 2021-09-23 ENCOUNTER — Ambulatory Visit (HOSPITAL_COMMUNITY): Payer: BC Managed Care – PPO | Admitting: Certified Registered"

## 2021-09-23 DIAGNOSIS — Z800 Family history of malignant neoplasm of digestive organs: Secondary | ICD-10-CM | POA: Insufficient documentation

## 2021-09-23 DIAGNOSIS — Z1211 Encounter for screening for malignant neoplasm of colon: Secondary | ICD-10-CM | POA: Diagnosis not present

## 2021-09-23 DIAGNOSIS — Z9049 Acquired absence of other specified parts of digestive tract: Secondary | ICD-10-CM | POA: Insufficient documentation

## 2021-09-23 DIAGNOSIS — Z79899 Other long term (current) drug therapy: Secondary | ICD-10-CM | POA: Insufficient documentation

## 2021-09-23 DIAGNOSIS — K219 Gastro-esophageal reflux disease without esophagitis: Secondary | ICD-10-CM | POA: Diagnosis not present

## 2021-09-23 DIAGNOSIS — K648 Other hemorrhoids: Secondary | ICD-10-CM | POA: Insufficient documentation

## 2021-09-23 HISTORY — PX: COLONOSCOPY WITH PROPOFOL: SHX5780

## 2021-09-23 SURGERY — COLONOSCOPY WITH PROPOFOL
Anesthesia: General

## 2021-09-23 MED ORDER — LACTATED RINGERS IV SOLN: INTRAVENOUS | Status: DC

## 2021-09-23 MED ORDER — PROPOFOL 10 MG/ML IV BOLUS
INTRAVENOUS | Status: DC | PRN
Start: 2021-09-23 — End: 2021-09-23
  Administered 2021-09-23: 20 mg via INTRAVENOUS
  Administered 2021-09-23: 50 mg via INTRAVENOUS

## 2021-09-23 MED ORDER — LIDOCAINE HCL (PF) 2 % IJ SOLN
INTRAMUSCULAR | Status: AC
  Filled 2021-09-23: qty 10

## 2021-09-23 MED ORDER — PROPOFOL 500 MG/50ML IV EMUL
INTRAVENOUS | Status: DC | PRN
  Administered 2021-09-23: 250 ug/kg/min via INTRAVENOUS

## 2021-09-23 MED ORDER — LIDOCAINE HCL (PF) 2 % IJ SOLN
INTRAMUSCULAR | Status: AC
  Filled 2021-09-23: qty 5

## 2021-09-23 MED ORDER — STERILE WATER FOR IRRIGATION IR SOLN
Status: DC | PRN
  Administered 2021-09-23: 60 mL

## 2021-09-23 MED ORDER — LACTATED RINGERS IV SOLN
INTRAVENOUS | Status: DC | PRN
Start: 2021-09-23 — End: 2021-09-23

## 2021-09-23 MED ORDER — PROPOFOL 1000 MG/100ML IV EMUL
INTRAVENOUS | Status: AC
  Filled 2021-09-23: qty 300

## 2021-09-23 MED ORDER — LIDOCAINE 2% (20 MG/ML) 5 ML SYRINGE
INTRAMUSCULAR | Status: DC | PRN
  Administered 2021-09-23: 50 mg via INTRAVENOUS

## 2021-09-23 NOTE — Interval H&P Note (Signed)
History and Physical Interval Note:  09/23/2021 7:31 AM  Stephanie [name]  has presented today for surgery, with the diagnosis of colon cancer screening.  The various methods of treatment have been discussed with the patient and family. After consideration of risks, benefits and other options for treatment, the patient has consented to  Procedure(s) with comments: COLONOSCOPY WITH PROPOFOL (N/A) - 7:30am as a surgical intervention.  The patient's history has been reviewed, patient examined, no change in status, stable for surgery.  I have reviewed the patient's chart and labs.  Questions were answered to the patient's satisfaction.     Eloise Harman

## 2021-09-23 NOTE — Anesthesia Postprocedure Evaluation (Signed)
Anesthesia Post Note  Patient: Stephanie [name]  Procedure(s) Performed: COLONOSCOPY WITH PROPOFOL  Patient location during evaluation: Phase II Anesthesia Type: General Level of consciousness: awake Pain management: pain level controlled Vital Signs Assessment: post-procedure vital signs reviewed and stable Respiratory status: spontaneous breathing and respiratory function stable Cardiovascular status: blood pressure returned to baseline and stable Postop Assessment: no headache and no apparent nausea or vomiting Anesthetic complications: no Comments: Late entry   No notable events documented.   Last Vitals:  Vitals:   09/23/21 0753 09/23/21 0756  BP: (!) 99/58 (!) 101/58  Pulse:    Resp: (!) 22   Temp: 36.8 C   SpO2: 98% 99%    Last Pain:  Vitals:   09/23/21 0753  TempSrc: Oral  PainSc: 0-No pain                 Louann Sjogren

## 2021-09-23 NOTE — Discharge Instructions (Addendum)

## 2021-09-23 NOTE — Transfer of Care (Signed)
Immediate Anesthesia Transfer of Care Note  Patient: SHAUNIE BOEHM  Procedure(s) Performed: COLONOSCOPY WITH PROPOFOL  Patient Location: Endoscopy Unit  Anesthesia Type:MAC  Level of Consciousness: sedated, patient cooperative and responds to stimulation  Airway & Oxygen Therapy: Patient Spontanous Breathing  Post-op Assessment: Report given to RN and Post -op Vital signs reviewed and stable  Post vital signs: Reviewed and stable  Last Vitals:  Vitals Value Taken Time  BP    Temp    Pulse    Resp    SpO2      Last Pain:  Vitals:   09/23/21 0736  TempSrc:   PainSc: 0-No pain      Patients Stated Pain Goal: 5 (14/38/88 7579)  Complications: No notable events documented.

## 2021-09-23 NOTE — Op Note (Signed)
[facility] Patient Name: Stephanie [name] Procedure Date: 09/23/2021 7:06 AM MRN: [id number] Date of Birth: 02-Nov-1955 Attending MD: Elon Alas. Abbey Chatters DO CSN: [id number] Age: 67 Admit Type: Outpatient Procedure:                Colonoscopy Indications:              Screening for colorectal malignant neoplasm Providers:                Elon Alas. Abbey Chatters, DO, Crystal Page, Wynonia Musty Tech, Technician Referring MD:              Medicines:                See the Anesthesia note for documentation of the                            administered medications Complications:            No immediate complications. Estimated Blood Loss:     Estimated blood loss: none. Procedure:                Pre-Anesthesia Assessment:                           - The anesthesia plan was to use monitored                            anesthesia care (MAC).                           After obtaining informed consent, the colonoscope                            was passed under direct vision. Throughout the                            procedure, the patient's blood pressure, pulse, and                            oxygen saturations were monitored continuously. The                            [id number]-HQ190L ([id number]) scope was introduced through                            the anus and advanced to the the terminal ileum,                            with identification of the appendiceal orifice and                            IC valve. The colonoscopy was performed without                            difficulty. The patient tolerated the procedure  well. The quality of the bowel preparation was                            evaluated using the BBPS [facility] Bowel Preparation                            Scale) with scores of: Right Colon = 3, Transverse                            Colon = 3 and Left Colon = 3 (entire mucosa seen                            well with no residual  staining, small fragments of                            stool or opaque liquid). The total BBPS score                            equals 9. Scope In: 7:39:43 AM Scope Out: 7:50:20 AM Scope Withdrawal Time: 0 hours 6 minutes 36 seconds  Total Procedure Duration: 0 hours 10 minutes 37 seconds  Findings:      Non-bleeding internal hemorrhoids were found during endoscopy.      The terminal ileum appeared normal.      The entire examined colon appeared normal. Impression:               - Non-bleeding internal hemorrhoids.                           - The examined portion of the ileum was normal.                           - The entire examined colon is normal.                           - No specimens collected. Moderate Sedation:      Per Anesthesia Care Recommendation:           - Patient has a contact number available for                            emergencies. The signs and symptoms of potential                            delayed complications were discussed with the                            patient. Return to normal activities tomorrow.                            Written discharge instructions were provided to the                            patient.                           -  Resume previous diet.                           - Continue present medications.                           - Repeat colonoscopy in 10 years for screening                            purposes.                           - Return to GI clinic PRN. Procedure Code(s):        --- Professional ---                           L9357, Colorectal cancer screening; colonoscopy on                            individual not meeting criteria for high risk Diagnosis Code(s):        --- Professional ---                           Z12.11, Encounter for screening for malignant                            neoplasm of colon                           K64.8, Other hemorrhoids CPT copyright 2019 American Medical Association. All rights  reserved. The codes documented in this report are preliminary and upon coder review may  be revised to meet current compliance requirements. Elon Alas. Abbey Chatters, DO [place] [name], DO 09/23/2021 7:53:06 AM This report has been signed electronically. Number of Addenda: 0

## 2021-09-23 NOTE — Anesthesia Preprocedure Evaluation (Signed)
Anesthesia Evaluation  Patient identified by MRN, date of birth, ID band Patient awake    Reviewed: Allergy & Precautions, H&P , NPO status , Patient's Chart, lab work & pertinent test results, reviewed documented beta blocker date and time   Airway Mallampati: II  TM Distance: >3 FB Neck ROM: full    Dental no notable dental hx.    Pulmonary neg pulmonary ROS,    Pulmonary exam normal breath sounds clear to auscultation       Cardiovascular Exercise Tolerance: Good negative cardio ROS   Rhythm:regular Rate:Normal     Neuro/Psych  Headaches, negative psych ROS   GI/Hepatic Neg liver ROS, GERD  Medicated,  Endo/Other  negative endocrine ROS  Renal/GU negative Renal ROS  negative genitourinary   Musculoskeletal   Abdominal   Peds  Hematology negative hematology ROS (+)   Anesthesia Other Findings   Reproductive/Obstetrics negative OB ROS                             Anesthesia Physical Anesthesia Plan  ASA: 2  Anesthesia Plan: General   Post-op Pain Management:    Induction:   PONV Risk Score and Plan: Propofol infusion  Airway Management Planned:   Additional Equipment:   Intra-op Plan:   Post-operative Plan:   Informed Consent: I have reviewed the patients History and Physical, chart, labs and discussed the procedure including the risks, benefits and alternatives for the proposed anesthesia with the patient or authorized representative who has indicated his/her understanding and acceptance.     Dental Advisory Given  Plan Discussed with: CRNA  Anesthesia Plan Comments:         Anesthesia Quick Evaluation  

## 2021-09-27 ENCOUNTER — Encounter (HOSPITAL_COMMUNITY): Payer: Self-pay | Admitting: Internal Medicine

## 2021-10-20 ENCOUNTER — Other Ambulatory Visit: Payer: BC Managed Care – PPO

## 2021-10-20 ENCOUNTER — Other Ambulatory Visit (HOSPITAL_BASED_OUTPATIENT_CLINIC_OR_DEPARTMENT_OTHER): Payer: BC Managed Care – PPO

## 2021-10-20 ENCOUNTER — Ambulatory Visit: Payer: BC Managed Care – PPO | Admitting: Hematology & Oncology

## 2021-10-27 ENCOUNTER — Inpatient Hospital Stay: Payer: BC Managed Care – PPO | Admitting: Hematology & Oncology

## 2021-10-27 ENCOUNTER — Inpatient Hospital Stay: Payer: BC Managed Care – PPO | Attending: Hematology & Oncology

## 2021-10-27 ENCOUNTER — Telehealth: Payer: Self-pay | Admitting: *Deleted

## 2021-10-27 ENCOUNTER — Encounter (HOSPITAL_BASED_OUTPATIENT_CLINIC_OR_DEPARTMENT_OTHER): Payer: Self-pay

## 2021-10-27 ENCOUNTER — Other Ambulatory Visit: Payer: Self-pay

## 2021-10-27 ENCOUNTER — Ambulatory Visit (HOSPITAL_BASED_OUTPATIENT_CLINIC_OR_DEPARTMENT_OTHER)
Admission: RE | Admit: 2021-10-27 | Discharge: 2021-10-27 | Disposition: A | Discharge status: Home / Self Care | Payer: BC Managed Care – PPO | Source: Ambulatory Visit | Attending: Hematology & Oncology | Admitting: Hematology & Oncology

## 2021-10-27 ENCOUNTER — Encounter: Payer: Self-pay | Admitting: Hematology & Oncology

## 2021-10-27 VITALS — BP 169/79 | HR 71 | Temp 97.5°F | Resp 18 | Ht 59.0 in | Wt 117.0 lb

## 2021-10-27 DIAGNOSIS — I700 Atherosclerosis of aorta: Secondary | ICD-10-CM | POA: Diagnosis not present

## 2021-10-27 DIAGNOSIS — R933 Abnormal findings on diagnostic imaging of other parts of digestive tract: Secondary | ICD-10-CM

## 2021-10-27 DIAGNOSIS — L989 Disorder of the skin and subcutaneous tissue, unspecified: Secondary | ICD-10-CM | POA: Diagnosis not present

## 2021-10-27 DIAGNOSIS — C49A3 Gastrointestinal stromal tumor of small intestine: Secondary | ICD-10-CM | POA: Diagnosis not present

## 2021-10-27 DIAGNOSIS — K3189 Other diseases of stomach and duodenum: Secondary | ICD-10-CM | POA: Diagnosis not present

## 2021-10-27 DIAGNOSIS — J984 Other disorders of lung: Secondary | ICD-10-CM | POA: Diagnosis not present

## 2021-10-27 DIAGNOSIS — D3502 Benign neoplasm of left adrenal gland: Secondary | ICD-10-CM | POA: Diagnosis not present

## 2021-10-27 DIAGNOSIS — Q059 Spina bifida, unspecified: Secondary | ICD-10-CM | POA: Diagnosis not present

## 2021-10-27 LAB — CMP (CANCER CENTER ONLY)
ALT: 10 U/L (ref 0–44)
AST: 14 U/L — ABNORMAL LOW (ref 15–41)
Albumin: 4.3 g/dL (ref 3.5–5.0)
Alkaline Phosphatase: 59 U/L (ref 38–126)
Anion gap: 5 (ref 5–15)
BUN: 12 mg/dL (ref 8–23)
CO2: 31 mmol/L (ref 22–32)
Calcium: 9.8 mg/dL (ref 8.9–10.3)
Chloride: 107 mmol/L (ref 98–111)
Creatinine: 0.79 mg/dL (ref 0.44–1.00)
GFR, Estimated: >60 mL/min (ref >60)
Glucose, Bld: 87 mg/dL (ref 70–99)
Potassium: 5 mmol/L (ref 3.5–5.1)
Sodium: 143 mmol/L (ref 135–145)
Total Bilirubin: 0.4 mg/dL (ref 0.3–1.2)
Total Protein: 6.6 g/dL (ref 6.5–8.1)

## 2021-10-27 LAB — CBC WITH DIFFERENTIAL (CANCER CENTER ONLY)
Abs Immature Granulocytes: 0.02 10*3/uL (ref 0.00–0.07)
Basophils Absolute: 0.1 10*3/uL (ref 0.0–0.1)
Basophils Relative: 1 %
Eosinophils Absolute: 0.5 10*3/uL (ref 0.0–0.5)
Eosinophils Relative: 8 %
HCT: 37.7 % (ref 36.0–46.0)
Hemoglobin: 12.4 g/dL (ref 12.0–15.0)
Immature Granulocytes: 0 %
Lymphocytes Relative: 18 %
Lymphs Abs: 1.2 10*3/uL (ref 0.7–4.0)
MCH: 31.6 pg (ref 26.0–34.0)
MCHC: 32.9 g/dL (ref 30.0–36.0)
MCV: 95.9 fL (ref 80.0–100.0)
Monocytes Absolute: 0.5 10*3/uL (ref 0.1–1.0)
Monocytes Relative: 7 %
Neutro Abs: 4.6 10*3/uL (ref 1.7–7.7)
Neutrophils Relative %: 66 %
Platelet Count: 187 10*3/uL (ref 150–400)
RBC: 3.93 MIL/uL (ref 3.87–5.11)
RDW: 13.8 % (ref 11.5–15.5)
WBC Count: 6.9 10*3/uL (ref 4.0–10.5)
nRBC: 0 % (ref 0.0–0.2)

## 2021-10-27 LAB — LACTATE DEHYDROGENASE: LDH: 182 U/L (ref 98–192)

## 2021-10-27 MED ORDER — IOHEXOL 300 MG/ML  SOLN
100.0000 mL | Freq: Once | INTRAMUSCULAR | Status: AC | PRN
  Administered 2021-10-27: 100 mL via INTRAVENOUS

## 2021-10-27 NOTE — Progress Notes (Signed)
?Hematology and Oncology Follow Up Visit ? ?Stephanie [name] ?[id number] ?[birth year] 66 y.o. ?10/27/2021 ? ? ?Principle Diagnosis:  ?1.  Gastrointestinal stromal tumors  ?2. Neurofibromatosis ? ?Current Therapy:   ?Gleevec 400 mg po q daily ?    ?Interim History:  [name] is back for followup.  We saw her last year.  I think we saw her back in September.  She has been doing quite well.  She had no problems over the holiday season. ? ?She is still working at the nursing home.  She enjoys this quite a bit. ? ?She did have a CT scan done today.  The CT scan not show any evidence of recurrent GIST. ? ?She has had no cough or shortness of breath.  She has had no issues with COVID. ? ?There is no change in bowel or bladder habits.  She has had no rashes. ? ?She does have neurofibromatosis.  Thankfully, has been no neurological issues with this. ? ?Overall, I would say performance status is probably ECOG 1.   ?  ?Medications:  ?Current Outpatient Medications:  ?  acetaminophen (TYLENOL) 500 MG tablet, Take 1,000 mg by mouth every 6 (six) hours as needed for moderate pain., Disp: , Rfl:  ?  CALCIUM-VITAMIN D PO, Take 1 tablet by mouth 2 (two) times daily. , Disp: , Rfl:  ?  fluticasone (FLONASE) 50 MCG/ACT nasal spray, Place 2 sprays into both nostrils daily., Disp: , Rfl:  ?  GLEEVEC 400 MG tablet, TAKE 1 TABLET BY MOUTH ONCE DAILY AT THE SAME TIME WITH FOOD AND A LARGE GLASS OF WATER. DO NOT CRUSH TABLETS. AVOID GRAPEFRUIT PRODUCTS. (Patient taking differently: Take 400 mg by mouth at bedtime. TAKE 1 TABLET BY MOUTH ONCE DAILY AT THE SAME TIME WITH FOOD AND A LARGE GLASS OF WATER. DO NOT CRUSH TABLETS. AVOID GRAPEFRUIT PRODUCTS.), Disp: 30 tablet, Rfl: 2 ?  melatonin 5 MG TABS, Take 5 mg by mouth at bedtime as needed (sleep)., Disp: , Rfl:  ?  omeprazole (PRILOSEC) 20 MG capsule, Take 20 mg by mouth daily., Disp: , Rfl:  ?  Polyvinyl Alcohol-Povidone (REFRESH OP), Place 1 drop into both eyes as needed (dry eyes). ,  Disp: , Rfl:  ?  rosuvastatin (CRESTOR) 10 MG tablet, Take 10 mg by mouth at bedtime., Disp: , Rfl:  ? ?Allergies: No Known Allergies ? ?Past Medical History, Surgical history, Social history, and Family History were reviewed and updated. ? ?Review of Systems: ?Review of Systems  ?Constitutional: Negative.   ?HENT: Negative.    ?Eyes: Negative.   ?Respiratory: Negative.    ?Cardiovascular: Negative.   ?Gastrointestinal: Negative.   ?Genitourinary: Negative.   ?Musculoskeletal: Negative.   ?Skin: Negative.   ?Neurological: Negative.   ?Endo/Heme/Allergies: Negative.   ?Psychiatric/Behavioral: Negative.    ? ? ?Physical Exam: ? height is '4\' 11"'$  (9.937 m) and weight is 53.1 kg. Her oral temperature is 97.5 ?F (36.4 ?C) (abnormal). Her blood pressure is 169/79 (abnormal) and her pulse is 71. Her respiration is 18 and oxygen saturation is 100%.  ? ?Physical Exam ?Vitals reviewed.  ?HENT:  ?   Head: Normocephalic and atraumatic.  ?Eyes:  ?   Pupils: Pupils are equal, round, and reactive to light.  ?Cardiovascular:  ?   Rate and Rhythm: Normal rate and regular rhythm.  ?   Heart sounds: Normal heart sounds.  ?Pulmonary:  ?   Effort: Pulmonary effort is normal.  ?   Breath sounds: Normal breath sounds.  ?  Abdominal:  ?   General: Bowel sounds are normal.  ?   Palpations: Abdomen is soft.  ?   Comments: Abdominal exam is soft.  She has well-healed laparotomy scar.  She has no fluid wave.  There is no abdominal mass.  There is no palpable liver or spleen tip.  ?Musculoskeletal:     ?   General: No tenderness or deformity. Normal range of motion.  ?   Cervical back: Normal range of motion.  ?Lymphadenopathy:  ?   Cervical: No cervical adenopathy.  ?Skin: ?   General: Skin is warm and dry.  ?   Findings: No erythema or rash.  ?   Comments: Skin exam shows numerous neurofibromas.  ?Neurological:  ?   Mental Status: She is alert and oriented to person, place, and time.  ?Psychiatric:     ?   Behavior: Behavior normal.     ?    Thought Content: Thought content normal.     ?   Judgment: Judgment normal.  ? ? ? ?Lab Results  ?Component Value Date  ? WBC 6.9 10/27/2021  ? HGB 12.4 10/27/2021  ? HCT 37.7 10/27/2021  ? MCV 95.9 10/27/2021  ? PLT 187 10/27/2021  ? ?  Chemistry   ?   ?Component Value Date/Time  ? NA 143 10/27/2021 0800  ? NA 148 (H) 07/27/2017 0831  ? NA 141 12/28/2015 0955  ? K 5.0 10/27/2021 0800  ? K 3.8 07/27/2017 0831  ? K 4.2 12/28/2015 0955  ? CL 107 10/27/2021 0800  ? CL 109 (H) 07/27/2017 0831  ? CO2 31 10/27/2021 0800  ? CO2 29 07/27/2017 0831  ? CO2 27 12/28/2015 0955  ? BUN 12 10/27/2021 0800  ? BUN 10 07/27/2017 0831  ? BUN 12.7 12/28/2015 0955  ? CREATININE 0.79 10/27/2021 0800  ? CREATININE 0.8 07/27/2017 0831  ? CREATININE 0.7 12/28/2015 0955  ?    ?Component Value Date/Time  ? CALCIUM 9.8 10/27/2021 0800  ? CALCIUM 9.2 07/27/2017 0831  ? CALCIUM 9.0 12/28/2015 0955  ? ALKPHOS 59 10/27/2021 0800  ? ALKPHOS 68 07/27/2017 0831  ? ALKPHOS 53 12/28/2015 0955  ? AST 14 (L) 10/27/2021 0800  ? AST 14 12/28/2015 0955  ? ALT 10 10/27/2021 0800  ? ALT 19 07/27/2017 0831  ? ALT 14 12/28/2015 0955  ? BILITOT 0.4 10/27/2021 0800  ? BILITOT 0.33 12/28/2015 0955  ?  ? ? ?Impression and Plan: ?Stephanie [name] is 66 year old female with a GIST. Marland Kitchen This is metastatic. She did have resection of  tumor that was symptomatic. Her surgery was back in June 2015.  ? ?I am happy that nothing really shows [place] that she has progression of the GIST.  She is doing well with Gleevec.  She is tolerating Gleevec well.  It is worked quite nicely. ? ?Again I am very impressed with the fact that she works all the time.  Had to give her a lot of credit for this. ? ?We will now get her through the Spring, Summer and have her come back in October.  We will get another CT scan when we see her back.   ? ? Stephanie Napoleon, MD ?3/16/202310:33 AM ?

## 2021-10-27 NOTE — Telephone Encounter (Signed)
Per 10/27/21 los - gave upcoming appointments - confirmed ?

## 2021-10-31 ENCOUNTER — Other Ambulatory Visit: Payer: Self-pay | Admitting: Hematology & Oncology

## 2021-10-31 DIAGNOSIS — C49A3 Gastrointestinal stromal tumor of small intestine: Secondary | ICD-10-CM

## 2021-12-02 DIAGNOSIS — Q8500 Neurofibromatosis, unspecified: Secondary | ICD-10-CM | POA: Diagnosis not present

## 2021-12-02 DIAGNOSIS — D490 Neoplasm of unspecified behavior of digestive system: Secondary | ICD-10-CM | POA: Diagnosis not present

## 2021-12-02 DIAGNOSIS — Z0000 Encounter for general adult medical examination without abnormal findings: Secondary | ICD-10-CM | POA: Diagnosis not present

## 2021-12-02 DIAGNOSIS — K219 Gastro-esophageal reflux disease without esophagitis: Secondary | ICD-10-CM | POA: Diagnosis not present

## 2021-12-02 DIAGNOSIS — E7849 Other hyperlipidemia: Secondary | ICD-10-CM | POA: Diagnosis not present

## 2021-12-02 DIAGNOSIS — M810 Age-related osteoporosis without current pathological fracture: Secondary | ICD-10-CM | POA: Diagnosis not present

## 2021-12-02 DIAGNOSIS — C49A3 Gastrointestinal stromal tumor of small intestine: Secondary | ICD-10-CM | POA: Diagnosis not present

## 2021-12-02 DIAGNOSIS — E559 Vitamin D deficiency, unspecified: Secondary | ICD-10-CM | POA: Diagnosis not present

## 2021-12-02 DIAGNOSIS — Z6824 Body mass index (BMI) 24.0-24.9, adult: Secondary | ICD-10-CM | POA: Diagnosis not present

## 2021-12-02 DIAGNOSIS — J302 Other seasonal allergic rhinitis: Secondary | ICD-10-CM | POA: Diagnosis not present

## 2021-12-02 DIAGNOSIS — E782 Mixed hyperlipidemia: Secondary | ICD-10-CM | POA: Diagnosis not present

## 2021-12-27 DIAGNOSIS — Z6823 Body mass index (BMI) 23.0-23.9, adult: Secondary | ICD-10-CM | POA: Diagnosis not present

## 2021-12-27 DIAGNOSIS — K529 Noninfective gastroenteritis and colitis, unspecified: Secondary | ICD-10-CM | POA: Diagnosis not present

## 2022-01-19 DIAGNOSIS — K219 Gastro-esophageal reflux disease without esophagitis: Secondary | ICD-10-CM | POA: Diagnosis not present

## 2022-01-19 DIAGNOSIS — J329 Chronic sinusitis, unspecified: Secondary | ICD-10-CM | POA: Diagnosis not present

## 2022-01-19 DIAGNOSIS — M810 Age-related osteoporosis without current pathological fracture: Secondary | ICD-10-CM | POA: Diagnosis not present

## 2022-01-19 DIAGNOSIS — E782 Mixed hyperlipidemia: Secondary | ICD-10-CM | POA: Diagnosis not present

## 2022-01-19 DIAGNOSIS — Z6823 Body mass index (BMI) 23.0-23.9, adult: Secondary | ICD-10-CM | POA: Diagnosis not present

## 2022-01-30 DIAGNOSIS — H5213 Myopia, bilateral: Secondary | ICD-10-CM | POA: Diagnosis not present

## 2022-02-15 ENCOUNTER — Other Ambulatory Visit: Payer: Self-pay | Admitting: Hematology & Oncology

## 2022-02-15 DIAGNOSIS — C49A3 Gastrointestinal stromal tumor of small intestine: Secondary | ICD-10-CM

## 2022-03-02 ENCOUNTER — Telehealth: Payer: Self-pay | Admitting: *Deleted

## 2022-03-02 NOTE — Chronic Care Management (AMB) (Signed)
  Care Management   Outreach Note  03/02/2022 Name: Stephanie [name] MRN: [id number] DOB: [birth year]  Referred by: Sharilyn Sites, MD Reason for referral : Care Coordination (Initial outreach to schedule with clinician from segmented list [place] [facility] 2)   An unsuccessful telephone outreach was attempted today. The patient was referred to the case management team for assistance with care management and care coordination.   Follow Up Plan:  A HIPAA compliant phone message was left for the patient providing contact information and requesting a return call.  The care management team will reach out to the patient again over the next 7 days.  If patient returns call to provider office, please advise to call [place]* at [phone].*  [place]  Direct Dial: [phone]

## 2022-03-08 NOTE — Chronic Care Management (AMB) (Signed)
  Care Coordination  Note  03/08/2022 Name: ORRA NOLDE MRN: [id number] DOB: 1955-12-06  SHAUNITA SENEY is a 66 y.o. year old female who is a primary care patient of Sharilyn Sites, MD. I reached out to Harriette Bouillon by phone today to offer care coordination services.      Ms. Chimenti was given information about Care Coordination services today including:  The Care Coordination services include support from the care team which includes your Nurse Coordinator, Clinical Social Worker, or Pharmacist.  The Care Coordination team is here to help remove barriers to the health concerns and goals most important to you. Care Coordination services are voluntary and the patient may decline or stop services at any time by request to their care team member.   Patient agreed to services and verbal consent obtained.   Follow up plan: Telephone appointment with care coordination team member scheduled for:03/16/22  [place]  Direct Dial: [phone]

## 2022-03-16 ENCOUNTER — Encounter: Payer: Self-pay | Admitting: *Deleted

## 2022-03-16 ENCOUNTER — Ambulatory Visit: Payer: Self-pay | Admitting: *Deleted

## 2022-03-16 NOTE — Patient Instructions (Signed)
Visit Information  Thank you for taking time to visit with me today. Please don't hesitate to contact me if I can be of assistance to you before our next scheduled telephone appointment.  Patient verbalizes understanding of instructions and care plan provided today and agrees to view in [place]. Active MyChart status and patient understanding of how to access instructions and care plan via MyChart confirmed with patient.     The patient has been provided with contact information for the care management team and has been advised to call with any health related questions or concerns.   Valente David, RN, MSN, [place] Coordination [phone]

## 2022-03-16 NOTE — Patient Outreach (Signed)
  Care Coordination   Initial Visit Note   03/16/2022 Name: LARUEN RISSER MRN: [id number] DOB: 01/04/1956  Stephanie [name] is a 66 y.o. year old female who sees Sharilyn Sites, MD for primary care. I spoke with  Harriette Bouillon by phone today  What matters to the patients health and wellness today?  No health concerns today, will continue to attend regularly scheduled visits with PCP and oncology.      Goals Addressed   None     SDOH assessments and interventions completed:  Yes     Care Coordination Interventions Activated:  Yes  Care Coordination Interventions:  Yes, provided   Reviewed upcoming appointments with PCP and Oncology.    Follow up plan: No further intervention required.   Encounter Outcome:  Pt. Visit Completed   Valente David, RN, MSN, [place] Coordination [phone]

## 2022-04-13 ENCOUNTER — Telehealth: Payer: Self-pay | Admitting: Pharmacy Technician

## 2022-04-13 ENCOUNTER — Other Ambulatory Visit (HOSPITAL_COMMUNITY): Payer: Self-pay

## 2022-04-13 NOTE — Telephone Encounter (Signed)
Oral Oncology Patient Advocate Encounter  Prior Authorization for [place] has been approved.    PA# [id number] Effective dates: 04/13/2022 through 04/13/2023  Patient may continue to fill at [facility]    Lady Deutscher, CPhT-Adv Oncology Pharmacy Patient [place] Direct Number: [phone]  Fax: [phone]

## 2022-04-13 NOTE — Telephone Encounter (Addendum)
Oral Oncology Patient Advocate Encounter   Received notification that prior authorization for [place] is required.   PA submitted on 04/13/2022 via phone [phone] CASE #: [id number] Status is pending     Lady Deutscher, [place] Patient [place] Direct Number: [phone]  Fax: [phone]

## 2022-05-17 ENCOUNTER — Other Ambulatory Visit: Payer: Self-pay | Admitting: Hematology & Oncology

## 2022-05-17 DIAGNOSIS — C49A3 Gastrointestinal stromal tumor of small intestine: Secondary | ICD-10-CM

## 2022-05-25 ENCOUNTER — Other Ambulatory Visit: Payer: Self-pay

## 2022-05-25 ENCOUNTER — Encounter (HOSPITAL_BASED_OUTPATIENT_CLINIC_OR_DEPARTMENT_OTHER): Payer: Self-pay

## 2022-05-25 ENCOUNTER — Encounter: Payer: Self-pay | Admitting: *Deleted

## 2022-05-25 ENCOUNTER — Inpatient Hospital Stay (HOSPITAL_BASED_OUTPATIENT_CLINIC_OR_DEPARTMENT_OTHER): Payer: BC Managed Care – PPO | Admitting: Hematology & Oncology

## 2022-05-25 ENCOUNTER — Inpatient Hospital Stay: Payer: BC Managed Care – PPO | Attending: Hematology & Oncology

## 2022-05-25 ENCOUNTER — Telehealth: Payer: Self-pay | Admitting: *Deleted

## 2022-05-25 ENCOUNTER — Ambulatory Visit (HOSPITAL_BASED_OUTPATIENT_CLINIC_OR_DEPARTMENT_OTHER)
Admission: RE | Admit: 2022-05-25 | Discharge: 2022-05-25 | Disposition: A | Discharge status: Home / Self Care | Payer: BC Managed Care – PPO | Source: Ambulatory Visit | Attending: Hematology & Oncology | Admitting: Hematology & Oncology

## 2022-05-25 ENCOUNTER — Other Ambulatory Visit: Payer: Self-pay | Admitting: Hematology & Oncology

## 2022-05-25 ENCOUNTER — Encounter: Payer: Self-pay | Admitting: Hematology & Oncology

## 2022-05-25 VITALS — BP 139/69 | HR 74 | Temp 97.9°F | Resp 18 | Ht 59.0 in | Wt 116.8 lb

## 2022-05-25 DIAGNOSIS — C49A3 Gastrointestinal stromal tumor of small intestine: Secondary | ICD-10-CM | POA: Insufficient documentation

## 2022-05-25 DIAGNOSIS — I700 Atherosclerosis of aorta: Secondary | ICD-10-CM | POA: Diagnosis not present

## 2022-05-25 DIAGNOSIS — D3500 Benign neoplasm of unspecified adrenal gland: Secondary | ICD-10-CM | POA: Diagnosis not present

## 2022-05-25 DIAGNOSIS — M47814 Spondylosis without myelopathy or radiculopathy, thoracic region: Secondary | ICD-10-CM | POA: Diagnosis not present

## 2022-05-25 DIAGNOSIS — Q8500 Neurofibromatosis, unspecified: Secondary | ICD-10-CM | POA: Insufficient documentation

## 2022-05-25 DIAGNOSIS — C49A0 Gastrointestinal stromal tumor, unspecified site: Secondary | ICD-10-CM | POA: Diagnosis not present

## 2022-05-25 DIAGNOSIS — Z85828 Personal history of other malignant neoplasm of skin: Secondary | ICD-10-CM | POA: Diagnosis not present

## 2022-05-25 LAB — CMP (CANCER CENTER ONLY)
ALT: 11 U/L (ref 0–44)
AST: 17 U/L (ref 15–41)
Albumin: 4.4 g/dL (ref 3.5–5.0)
Alkaline Phosphatase: 51 U/L (ref 38–126)
Anion gap: 5 (ref 5–15)
BUN: 11 mg/dL (ref 8–23)
CO2: 31 mmol/L (ref 22–32)
Calcium: 9.6 mg/dL (ref 8.9–10.3)
Chloride: 106 mmol/L (ref 98–111)
Creatinine: 0.78 mg/dL (ref 0.44–1.00)
GFR, Estimated: >60 mL/min (ref >60)
Glucose, Bld: 94 mg/dL (ref 70–99)
Potassium: 4.5 mmol/L (ref 3.5–5.1)
Sodium: 142 mmol/L (ref 135–145)
Total Bilirubin: 0.5 mg/dL (ref 0.3–1.2)
Total Protein: 6.5 g/dL (ref 6.5–8.1)

## 2022-05-25 LAB — CBC WITH DIFFERENTIAL (CANCER CENTER ONLY)
Abs Immature Granulocytes: 0.02 10*3/uL (ref 0.00–0.07)
Basophils Absolute: 0.1 10*3/uL (ref 0.0–0.1)
Basophils Relative: 1 %
Eosinophils Absolute: 0.4 10*3/uL (ref 0.0–0.5)
Eosinophils Relative: 7 %
HCT: 37.3 % (ref 36.0–46.0)
Hemoglobin: 12 g/dL (ref 12.0–15.0)
Immature Granulocytes: 0 %
Lymphocytes Relative: 24 %
Lymphs Abs: 1.5 10*3/uL (ref 0.7–4.0)
MCH: 31.3 pg (ref 26.0–34.0)
MCHC: 32.2 g/dL (ref 30.0–36.0)
MCV: 97.1 fL (ref 80.0–100.0)
Monocytes Absolute: 0.4 10*3/uL (ref 0.1–1.0)
Monocytes Relative: 7 %
Neutro Abs: 3.8 10*3/uL (ref 1.7–7.7)
Neutrophils Relative %: 61 %
Platelet Count: 189 10*3/uL (ref 150–400)
RBC: 3.84 MIL/uL — ABNORMAL LOW (ref 3.87–5.11)
RDW: 13.6 % (ref 11.5–15.5)
WBC Count: 6.1 10*3/uL (ref 4.0–10.5)
nRBC: 0 % (ref 0.0–0.2)

## 2022-05-25 LAB — LACTATE DEHYDROGENASE: LDH: 191 U/L (ref 98–192)

## 2022-05-25 MED ORDER — IOHEXOL 300 MG/ML  SOLN
100.0000 mL | Freq: Once | INTRAMUSCULAR | Status: AC | PRN
  Administered 2022-05-25: 100 mL via INTRAVENOUS

## 2022-05-25 NOTE — Progress Notes (Signed)
Hematology and Oncology Follow Up Visit  Stephanie [name] [id number] 01-31-1956 66 y.o. 05/25/2022   Principle Diagnosis:  1.  Gastrointestinal stromal tumors  2. Neurofibromatosis  Current Therapy:   Gleevec 400 mg po q daily     Interim History:  [name] is back for followup.  As always, she is doing quite well.  She is still working at the [employer].  She really enjoys this.  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  Thankfully, she has avoided COVID.  She had a CT scan done today.  The CT scan did not show any evidence of recurrent GIST.  She has been on [place] now probably for 8 years.  She has had no problems with nausea or vomiting.  There is been no change in bowel or bladder habits.  She has had no cough or shortness of breath.  There is been no leg swelling.  She has had no bleeding.  Patient does have neurofibromatosis.  Thankfully, this has not been a problem for her.  Overall, I would say performance status is probably ECOG 1.     Medications:  Current Outpatient Medications:    acetaminophen (TYLENOL) 500 MG tablet, Take 1,000 mg by mouth every 6 (six) hours as needed for moderate pain., Disp: , Rfl:    CALCIUM-VITAMIN D PO, Take 1 tablet by mouth 2 (two) times daily. , Disp: , Rfl:    GLEEVEC 400 MG tablet, TAKE 1 TABLET BY MOUTH ONCE DAILY AT THE SAME TIME WITH FOOD AND A LARGE GLASS OF WATER. DO NOT CRUSH TABLETS. AVOID GRAPEFRUIT PRODUCTS., Disp: 30 tablet, Rfl: 2   meclizine (ANTIVERT) 25 MG tablet, Take 1 tablet by mouth every 6 (six) hours as needed., Disp: , Rfl:    melatonin 5 MG TABS, Take 5 mg by mouth at bedtime as needed (sleep)., Disp: , Rfl:    omeprazole (PRILOSEC) 20 MG capsule, Take 20 mg by mouth daily., Disp: , Rfl:    ondansetron (ZOFRAN-ODT) 4 MG disintegrating tablet, Take 4-8 mg by mouth every 6 (six) hours as needed., Disp: , Rfl:    Polyvinyl Alcohol-Povidone (REFRESH OP), Place 1 drop into both eyes as needed (dry eyes). ,  Disp: , Rfl:    rosuvastatin (CRESTOR) 10 MG tablet, Take 10 mg by mouth at bedtime., Disp: , Rfl:    zolpidem (AMBIEN) 10 MG tablet, Take 10 mg by mouth daily., Disp: , Rfl:   Allergies: No Known Allergies  Past Medical History, Surgical history, Social history, and Family History were reviewed and updated.  Review of Systems: Review of Systems  Constitutional: Negative.   HENT: Negative.    Eyes: Negative.   Respiratory: Negative.    Cardiovascular: Negative.   Gastrointestinal: Negative.   Genitourinary: Negative.   Musculoskeletal: Negative.   Skin: Negative.   Neurological: Negative.   Endo/Heme/Allergies: Negative.   Psychiatric/Behavioral: Negative.       Physical Exam:  height is '4\' 11"'$  (1.499 m) and weight is 116 lb 12.8 oz (53 kg). Her oral temperature is 97.9 F (36.6 C). Her blood pressure is 139/69 and her pulse is 74. Her respiration is 18 and oxygen saturation is 100%.   Physical Exam Vitals reviewed.  HENT:     Head: Normocephalic and atraumatic.  Eyes:     Pupils: Pupils are equal, round, and reactive to light.  Cardiovascular:     Rate and Rhythm: Normal rate and regular rhythm.     Heart sounds:  Normal heart sounds.  Pulmonary:     Effort: Pulmonary effort is normal.     Breath sounds: Normal breath sounds.  Abdominal:     General: Bowel sounds are normal.     Palpations: Abdomen is soft.     Comments: Abdominal exam is soft.  She has well-healed laparotomy scar.  She has no fluid wave.  There is no abdominal mass.  There is no palpable liver or spleen tip.  Musculoskeletal:        General: No tenderness or deformity. Normal range of motion.     Cervical back: Normal range of motion.  Lymphadenopathy:     Cervical: No cervical adenopathy.  Skin:    General: Skin is warm and dry.     Findings: No erythema or rash.     Comments: Skin exam shows numerous neurofibromas.  Neurological:     Mental Status: She is alert and oriented to person, place,  and time.  Psychiatric:        Behavior: Behavior normal.        Thought Content: Thought content normal.        Judgment: Judgment normal.      Lab Results  Component Value Date   WBC 6.1 05/25/2022   HGB 12.0 05/25/2022   HCT 37.3 05/25/2022   MCV 97.1 05/25/2022   PLT 189 05/25/2022     Chemistry      Component Value Date/Time   NA 142 05/25/2022 1003   NA 148 (H) 07/27/2017 0831   NA 141 12/28/2015 0955   K 4.5 05/25/2022 1003   K 3.8 07/27/2017 0831   K 4.2 12/28/2015 0955   CL 106 05/25/2022 1003   CL 109 (H) 07/27/2017 0831   CO2 31 05/25/2022 1003   CO2 29 07/27/2017 0831   CO2 27 12/28/2015 0955   BUN 11 05/25/2022 1003   BUN 10 07/27/2017 0831   BUN 12.7 12/28/2015 0955   CREATININE 0.78 05/25/2022 1003   CREATININE 0.8 07/27/2017 0831   CREATININE 0.7 12/28/2015 0955      Component Value Date/Time   CALCIUM 9.6 05/25/2022 1003   CALCIUM 9.2 07/27/2017 0831   CALCIUM 9.0 12/28/2015 0955   ALKPHOS 51 05/25/2022 1003   ALKPHOS 68 07/27/2017 0831   ALKPHOS 53 12/28/2015 0955   AST 17 05/25/2022 1003   AST 14 12/28/2015 0955   ALT 11 05/25/2022 1003   ALT 19 07/27/2017 0831   ALT 14 12/28/2015 0955   BILITOT 0.5 05/25/2022 1003   BILITOT 0.33 12/28/2015 0955      Impression and Plan: Stephanie [name] is 66 year old female with a GIST. Marland Kitchen This is metastatic. She did have resection of  tumor that was symptomatic. Her surgery was back in June 2015.   I am just happy that everything looks so good.  I am glad that she is tolerating the [place] well.  Hopefully, we can move her appointments now to every 9 months.  I think this to be reasonable.  Always, we do a CT scan same day that we see her.  We get this set up for next year.  She knows that she can always come in to see [place] if there are any problems before 9 months.      Volanda Napoleon, MD 10/12/202311:37 AM

## 2022-05-25 NOTE — Telephone Encounter (Signed)
Per 05/25/22 los - gave upcoming appointments - confirmed

## 2022-05-29 ENCOUNTER — Other Ambulatory Visit: Payer: BC Managed Care – PPO

## 2022-05-29 ENCOUNTER — Ambulatory Visit: Payer: BC Managed Care – PPO | Admitting: Hematology & Oncology

## 2022-05-29 ENCOUNTER — Other Ambulatory Visit (HOSPITAL_BASED_OUTPATIENT_CLINIC_OR_DEPARTMENT_OTHER): Payer: BC Managed Care – PPO

## 2022-05-30 ENCOUNTER — Other Ambulatory Visit: Payer: BC Managed Care – PPO

## 2022-05-30 ENCOUNTER — Ambulatory Visit (HOSPITAL_BASED_OUTPATIENT_CLINIC_OR_DEPARTMENT_OTHER): Payer: BC Managed Care – PPO

## 2022-05-30 ENCOUNTER — Ambulatory Visit: Payer: BC Managed Care – PPO | Admitting: Family

## 2022-06-05 DIAGNOSIS — M79674 Pain in right toe(s): Secondary | ICD-10-CM | POA: Diagnosis not present

## 2022-06-05 DIAGNOSIS — M2241 Chondromalacia patellae, right knee: Secondary | ICD-10-CM | POA: Diagnosis not present

## 2022-08-09 ENCOUNTER — Other Ambulatory Visit (HOSPITAL_COMMUNITY): Payer: Self-pay | Admitting: Family Medicine

## 2022-08-09 DIAGNOSIS — Z1231 Encounter for screening mammogram for malignant neoplasm of breast: Secondary | ICD-10-CM

## 2022-08-21 ENCOUNTER — Other Ambulatory Visit: Payer: Self-pay | Admitting: Hematology & Oncology

## 2022-08-21 DIAGNOSIS — C49A3 Gastrointestinal stromal tumor of small intestine: Secondary | ICD-10-CM

## 2022-09-14 ENCOUNTER — Ambulatory Visit (HOSPITAL_COMMUNITY): Payer: BC Managed Care – PPO

## 2022-09-18 ENCOUNTER — Ambulatory Visit (HOSPITAL_COMMUNITY)
Admission: RE | Admit: 2022-09-18 | Discharge: 2022-09-18 | Disposition: A | Discharge status: Home / Self Care | Payer: BC Managed Care – PPO | Source: Ambulatory Visit | Attending: Family Medicine | Admitting: Family Medicine

## 2022-09-18 DIAGNOSIS — Z1231 Encounter for screening mammogram for malignant neoplasm of breast: Secondary | ICD-10-CM | POA: Diagnosis not present

## 2022-10-26 DIAGNOSIS — Z6823 Body mass index (BMI) 23.0-23.9, adult: Secondary | ICD-10-CM | POA: Diagnosis not present

## 2022-10-26 DIAGNOSIS — S239XXA Sprain of unspecified parts of thorax, initial encounter: Secondary | ICD-10-CM | POA: Diagnosis not present

## 2022-11-01 ENCOUNTER — Ambulatory Visit (HOSPITAL_COMMUNITY)
Admission: RE | Admit: 2022-11-01 | Discharge: 2022-11-01 | Disposition: A | Discharge status: Home / Self Care | Payer: BC Managed Care – PPO | Source: Ambulatory Visit | Attending: Internal Medicine | Admitting: Internal Medicine

## 2022-11-01 ENCOUNTER — Other Ambulatory Visit (HOSPITAL_COMMUNITY): Payer: Self-pay | Admitting: Internal Medicine

## 2022-11-01 DIAGNOSIS — I700 Atherosclerosis of aorta: Secondary | ICD-10-CM | POA: Diagnosis not present

## 2022-11-01 DIAGNOSIS — Q8500 Neurofibromatosis, unspecified: Secondary | ICD-10-CM | POA: Diagnosis not present

## 2022-11-01 DIAGNOSIS — M40204 Unspecified kyphosis, thoracic region: Secondary | ICD-10-CM | POA: Diagnosis not present

## 2022-11-01 DIAGNOSIS — S239XXA Sprain of unspecified parts of thorax, initial encounter: Secondary | ICD-10-CM | POA: Diagnosis not present

## 2022-11-01 DIAGNOSIS — M546 Pain in thoracic spine: Secondary | ICD-10-CM | POA: Diagnosis not present

## 2022-11-01 DIAGNOSIS — Z6823 Body mass index (BMI) 23.0-23.9, adult: Secondary | ICD-10-CM | POA: Diagnosis not present

## 2022-11-02 ENCOUNTER — Other Ambulatory Visit (HOSPITAL_COMMUNITY): Payer: Self-pay | Admitting: Internal Medicine

## 2022-11-02 DIAGNOSIS — M810 Age-related osteoporosis without current pathological fracture: Secondary | ICD-10-CM

## 2022-11-09 ENCOUNTER — Ambulatory Visit (HOSPITAL_COMMUNITY)
Admission: RE | Admit: 2022-11-09 | Discharge: 2022-11-09 | Disposition: A | Discharge status: Home / Self Care | Payer: BC Managed Care – PPO | Source: Ambulatory Visit | Attending: Internal Medicine | Admitting: Internal Medicine

## 2022-11-09 DIAGNOSIS — M810 Age-related osteoporosis without current pathological fracture: Secondary | ICD-10-CM | POA: Insufficient documentation

## 2022-12-08 ENCOUNTER — Other Ambulatory Visit: Payer: Self-pay | Admitting: Hematology & Oncology

## 2022-12-08 DIAGNOSIS — C49A3 Gastrointestinal stromal tumor of small intestine: Secondary | ICD-10-CM

## 2022-12-11 DIAGNOSIS — J302 Other seasonal allergic rhinitis: Secondary | ICD-10-CM | POA: Diagnosis not present

## 2022-12-11 DIAGNOSIS — Z6823 Body mass index (BMI) 23.0-23.9, adult: Secondary | ICD-10-CM | POA: Diagnosis not present

## 2022-12-11 DIAGNOSIS — Q8500 Neurofibromatosis, unspecified: Secondary | ICD-10-CM | POA: Diagnosis not present

## 2022-12-11 DIAGNOSIS — S239XXA Sprain of unspecified parts of thorax, initial encounter: Secondary | ICD-10-CM | POA: Diagnosis not present

## 2022-12-11 DIAGNOSIS — E782 Mixed hyperlipidemia: Secondary | ICD-10-CM | POA: Diagnosis not present

## 2022-12-11 DIAGNOSIS — E7849 Other hyperlipidemia: Secondary | ICD-10-CM | POA: Diagnosis not present

## 2022-12-11 DIAGNOSIS — C49A3 Gastrointestinal stromal tumor of small intestine: Secondary | ICD-10-CM | POA: Diagnosis not present

## 2022-12-11 DIAGNOSIS — Z0001 Encounter for general adult medical examination with abnormal findings: Secondary | ICD-10-CM | POA: Diagnosis not present

## 2022-12-11 DIAGNOSIS — Z1331 Encounter for screening for depression: Secondary | ICD-10-CM | POA: Diagnosis not present

## 2022-12-11 DIAGNOSIS — I700 Atherosclerosis of aorta: Secondary | ICD-10-CM | POA: Diagnosis not present

## 2022-12-11 DIAGNOSIS — E559 Vitamin D deficiency, unspecified: Secondary | ICD-10-CM | POA: Diagnosis not present

## 2022-12-11 DIAGNOSIS — M810 Age-related osteoporosis without current pathological fracture: Secondary | ICD-10-CM | POA: Diagnosis not present

## 2023-01-18 ENCOUNTER — Encounter: Payer: Self-pay | Admitting: Plastic Surgery

## 2023-01-18 ENCOUNTER — Ambulatory Visit: Payer: BC Managed Care – PPO | Admitting: Plastic Surgery

## 2023-01-18 VITALS — BP 157/77 | HR 75 | Ht <= 58 in | Wt 115.4 lb

## 2023-01-18 DIAGNOSIS — Q8500 Neurofibromatosis, unspecified: Secondary | ICD-10-CM | POA: Diagnosis not present
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
## 2023-02-01 ENCOUNTER — Ambulatory Visit: Payer: BC Managed Care – PPO | Admitting: Plastic Surgery

## 2023-02-16 ENCOUNTER — Telehealth: Payer: Self-pay | Admitting: Plastic Surgery

## 2023-02-16 NOTE — Telephone Encounter (Signed)
Patient called to follow up on appointment from 02/01/23. She said Dr Ladona Ridgel said he was gonna discuss surgery options with Dr Floy Sabina and she just wanted to see if anything had been decided yet. Call back is [phone]

## 2023-02-19 ENCOUNTER — Inpatient Hospital Stay: Payer: BC Managed Care – PPO | Admitting: Medical Oncology

## 2023-02-19 ENCOUNTER — Ambulatory Visit (HOSPITAL_BASED_OUTPATIENT_CLINIC_OR_DEPARTMENT_OTHER)
Admission: RE | Admit: 2023-02-19 | Discharge: 2023-02-19 | Disposition: A | Discharge status: Home / Self Care | Payer: BC Managed Care – PPO | Source: Ambulatory Visit | Attending: Hematology & Oncology | Admitting: Hematology & Oncology

## 2023-02-19 ENCOUNTER — Inpatient Hospital Stay: Payer: BC Managed Care – PPO | Attending: Hematology & Oncology

## 2023-02-19 ENCOUNTER — Encounter: Payer: Self-pay | Admitting: Medical Oncology

## 2023-02-19 ENCOUNTER — Other Ambulatory Visit: Payer: Self-pay

## 2023-02-19 VITALS — BP 144/68 | HR 75 | Temp 98.1°F | Resp 18 | Ht 59.0 in | Wt 115.8 lb

## 2023-02-19 DIAGNOSIS — Z79899 Other long term (current) drug therapy: Secondary | ICD-10-CM | POA: Insufficient documentation

## 2023-02-19 DIAGNOSIS — C49A3 Gastrointestinal stromal tumor of small intestine: Secondary | ICD-10-CM

## 2023-02-19 DIAGNOSIS — E049 Nontoxic goiter, unspecified: Secondary | ICD-10-CM | POA: Diagnosis not present

## 2023-02-19 DIAGNOSIS — R918 Other nonspecific abnormal finding of lung field: Secondary | ICD-10-CM | POA: Diagnosis not present

## 2023-02-19 DIAGNOSIS — D3502 Benign neoplasm of left adrenal gland: Secondary | ICD-10-CM | POA: Diagnosis not present

## 2023-02-19 LAB — CBC WITH DIFFERENTIAL (CANCER CENTER ONLY)
Abs Immature Granulocytes: 0.01 10*3/uL (ref 0.00–0.07)
Basophils Absolute: 0.1 10*3/uL (ref 0.0–0.1)
Basophils Relative: 1 %
Eosinophils Absolute: 0.3 10*3/uL (ref 0.0–0.5)
Eosinophils Relative: 4 %
HCT: 38.3 % (ref 36.0–46.0)
Hemoglobin: 12.4 g/dL (ref 12.0–15.0)
Immature Granulocytes: 0 %
Lymphocytes Relative: 21 %
Lymphs Abs: 1.4 10*3/uL (ref 0.7–4.0)
MCH: 31.5 pg (ref 26.0–34.0)
MCHC: 32.4 g/dL (ref 30.0–36.0)
MCV: 97.2 fL (ref 80.0–100.0)
Monocytes Absolute: 0.4 10*3/uL (ref 0.1–1.0)
Monocytes Relative: 6 %
Neutro Abs: 4.4 10*3/uL (ref 1.7–7.7)
Neutrophils Relative %: 68 %
Platelet Count: 160 10*3/uL (ref 150–400)
RBC: 3.94 MIL/uL (ref 3.87–5.11)
RDW: 13.4 % (ref 11.5–15.5)
WBC Count: 6.6 10*3/uL (ref 4.0–10.5)
nRBC: 0 % (ref 0.0–0.2)

## 2023-02-19 LAB — CMP (CANCER CENTER ONLY)
ALT: 10 U/L (ref 0–44)
AST: 14 U/L — ABNORMAL LOW (ref 15–41)
Albumin: 4.5 g/dL (ref 3.5–5.0)
Alkaline Phosphatase: 45 U/L (ref 38–126)
Anion gap: 6 (ref 5–15)
BUN: 12 mg/dL (ref 8–23)
CO2: 31 mmol/L (ref 22–32)
Calcium: 9.9 mg/dL (ref 8.9–10.3)
Chloride: 108 mmol/L (ref 98–111)
Creatinine: 0.85 mg/dL (ref 0.44–1.00)
GFR, Estimated: >60 mL/min (ref >60)
Glucose, Bld: 100 mg/dL — ABNORMAL HIGH (ref 70–99)
Potassium: 5.1 mmol/L (ref 3.5–5.1)
Sodium: 145 mmol/L (ref 135–145)
Total Bilirubin: 0.4 mg/dL (ref 0.3–1.2)
Total Protein: 6.7 g/dL (ref 6.5–8.1)

## 2023-02-19 LAB — LACTATE DEHYDROGENASE: LDH: 190 U/L (ref 98–192)

## 2023-02-19 MED ORDER — IOHEXOL 300 MG/ML  SOLN
100.0000 mL | Freq: Once | INTRAMUSCULAR | Status: AC | PRN
  Administered 2023-02-19: 80 mL via INTRAVENOUS

## 2023-02-19 NOTE — Progress Notes (Signed)
Hematology and Oncology Follow Up Visit  Stephanie [name] [id number] 1956/07/02 67 y.o. 02/19/2023   Principle Diagnosis:  1. Gastrointestinal stromal tumors  2. Neurofibromatosis  Current Therapy:   Gleevec 400 mg po q daily     Interim History:  [name] is back for followup.  She is here with her eldest daughter.   She continues to be on Gleevec; she has been on this medication for over 8 years. She is tolerating this medication well.   She has had no problems with nausea or vomiting.  There is been no change in bowel or bladder habits.  She has had no cough or shortness of breath.  There is been no leg swelling.  She has had no bleeding.  Patient does have neurofibromatosis.  Thankfully, this has not been a problem for her.  Overall, I would say performance status is probably ECOG 1.    Wt Readings from Last 3 Encounters:  01/18/23 115 lb 6.4 oz (52.3 kg)  05/25/22 116 lb 12.8 oz (53 kg)  10/27/21 117 lb (53.1 kg)    Medications:  Current Outpatient Medications:    acetaminophen (TYLENOL) 500 MG tablet, Take 1,000 mg by mouth every 6 (six) hours as needed for moderate pain., Disp: , Rfl:    alendronate (FOSAMAX) 70 MG tablet, Take 70 mg by mouth once a week., Disp: , Rfl:    CALCIUM-VITAMIN D PO, Take 1 tablet by mouth 2 (two) times daily. , Disp: , Rfl:    GLEEVEC 400 MG tablet, TAKE 1 TABLET BY MOUTH ONCE DAILY AT THE SAME TIME WITH FOOD AND A LARGE GLASS OF WATER. DO NOT CRUSH TABLETS. AVOID GRAPEFRUIT PRODUCTS., Disp: 30 tablet, Rfl: 2   meclizine (ANTIVERT) 25 MG tablet, Take 1 tablet by mouth every 6 (six) hours as needed., Disp: , Rfl:    melatonin 5 MG TABS, Take 5 mg by mouth at bedtime as needed (sleep)., Disp: , Rfl:    omeprazole (PRILOSEC) 20 MG capsule, Take 20 mg by mouth daily., Disp: , Rfl:    ondansetron (ZOFRAN-ODT) 4 MG disintegrating tablet, Take 4-8 mg by mouth every 6 (six) hours as needed., Disp: , Rfl:    Polyvinyl Alcohol-Povidone (REFRESH OP),  Place 1 drop into both eyes as needed (dry eyes). , Disp: , Rfl:    rosuvastatin (CRESTOR) 10 MG tablet, Take 10 mg by mouth at bedtime., Disp: , Rfl:    zolpidem (AMBIEN) 10 MG tablet, Take 10 mg by mouth daily., Disp: , Rfl:   Allergies: No Known Allergies  Past Medical History, Surgical history, Social history, and Family History were reviewed and updated.  Review of Systems: Review of Systems  Constitutional: Negative.   HENT: Negative.    Eyes: Negative.   Respiratory: Negative.    Cardiovascular: Negative.   Gastrointestinal: Negative.   Genitourinary: Negative.   Musculoskeletal: Negative.   Skin: Negative.   Neurological: Negative.   Endo/Heme/Allergies: Negative.   Psychiatric/Behavioral: Negative.       Physical Exam:  vitals were not taken for this visit.   Physical Exam Vitals reviewed.  HENT:     Head: Normocephalic and atraumatic.  Eyes:     Pupils: Pupils are equal, round, and reactive to light.  Cardiovascular:     Rate and Rhythm: Normal rate and regular rhythm.     Heart sounds: Normal heart sounds.  Pulmonary:     Effort: Pulmonary effort is normal.     Breath sounds: Normal breath sounds.  Abdominal:     General: Bowel sounds are normal.     Palpations: Abdomen is soft.     Comments: Abdominal exam is soft.  She has well-healed laparotomy scar.  She has no fluid wave.  There is no abdominal mass.  There is no palpable liver or spleen tip.  Musculoskeletal:        General: No tenderness or deformity. Normal range of motion.     Cervical back: Normal range of motion.  Lymphadenopathy:     Cervical: No cervical adenopathy.  Skin:    General: Skin is warm and dry.     Findings: No erythema or rash.     Comments: Skin exam shows numerous neurofibromas.  Neurological:     Mental Status: She is alert and oriented to person, place, and time.  Psychiatric:        Behavior: Behavior normal.        Thought Content: Thought content normal.         Judgment: Judgment normal.      Lab Results  Component Value Date   WBC 6.6 02/19/2023   HGB 12.4 02/19/2023   HCT 38.3 02/19/2023   MCV 97.2 02/19/2023   PLT 160 02/19/2023     Chemistry      Component Value Date/Time   NA 145 02/19/2023 0834   NA 148 (H) 07/27/2017 0831   NA 141 12/28/2015 0955   K 5.1 02/19/2023 0834   K 3.8 07/27/2017 0831   K 4.2 12/28/2015 0955   CL 108 02/19/2023 0834   CL 109 (H) 07/27/2017 0831   CO2 31 02/19/2023 0834   CO2 29 07/27/2017 0831   CO2 27 12/28/2015 0955   BUN 12 02/19/2023 0834   BUN 10 07/27/2017 0831   BUN 12.7 12/28/2015 0955   CREATININE 0.85 02/19/2023 0834   CREATININE 0.8 07/27/2017 0831   CREATININE 0.7 12/28/2015 0955      Component Value Date/Time   CALCIUM 9.9 02/19/2023 0834   CALCIUM 9.2 07/27/2017 0831   CALCIUM 9.0 12/28/2015 0955   ALKPHOS 45 02/19/2023 0834   ALKPHOS 68 07/27/2017 0831   ALKPHOS 53 12/28/2015 0955   AST 14 (L) 02/19/2023 0834   AST 14 12/28/2015 0955   ALT 10 02/19/2023 0834   ALT 19 07/27/2017 0831   ALT 14 12/28/2015 0955   BILITOT 0.4 02/19/2023 0834   BILITOT 0.33 12/28/2015 0955      Impression and Plan: Stephanie [name] is 67 year old female with a GIST. This is metastatic. She did have resection of  tumor that was symptomatic. Her surgery was back in June 2015.   Today exam looks stable. I am happy that she is tolerating the Gleevec well.  CT from today show stability of disease. Labs also look reassuring. As per MD recommendations we will obtain a follow up CT in 9 months on the day of her follow up  RTC 9 months MD, labs(CBC w/, CMP, LDH), CT scan same day   Rushie Chestnut, PA-C 7/8/202410:05 AM

## 2023-03-01 NOTE — Telephone Encounter (Signed)
Pt called requesting status of Sx.  Please call her at [phone]

## 2023-03-21 ENCOUNTER — Other Ambulatory Visit (HOSPITAL_COMMUNITY): Payer: Self-pay

## 2023-03-26 ENCOUNTER — Other Ambulatory Visit: Payer: Self-pay | Admitting: Hematology & Oncology

## 2023-03-26 DIAGNOSIS — C49A3 Gastrointestinal stromal tumor of small intestine: Secondary | ICD-10-CM

## 2023-03-30 ENCOUNTER — Other Ambulatory Visit (HOSPITAL_COMMUNITY): Payer: Self-pay

## 2023-03-30 ENCOUNTER — Telehealth: Payer: Self-pay

## 2023-03-30 NOTE — Telephone Encounter (Signed)
Oral Oncology Patient Advocate Encounter  Re-authorization   Received notification that prior authorization for Gleevec is due for renewal.   PA submitted on 03/30/23  Key [id number]  Status is pending     Ardeen Fillers, CPhT Oncology Pharmacy Patient Advocate  [facility]  [phone] (phone) [phone] (fax) 03/30/2023 3:41 PM

## 2023-03-30 NOTE — Telephone Encounter (Signed)
Oral Oncology Patient Advocate Encounter  Prior Authorization for Gleevec has been approved.    PA# [id number]  Effective dates: 03/30/23 through 0816/25  Patient may continue to fill at [facility].    Ardeen Fillers, CPhT Oncology Pharmacy Patient Advocate  [facility]  [phone] (phone) [phone] (fax) 03/30/2023 4:01 PM

## 2023-04-03 DIAGNOSIS — M25512 Pain in left shoulder: Secondary | ICD-10-CM | POA: Diagnosis not present

## 2023-04-03 DIAGNOSIS — Z6824 Body mass index (BMI) 24.0-24.9, adult: Secondary | ICD-10-CM | POA: Diagnosis not present

## 2023-04-03 NOTE — Telephone Encounter (Signed)
Patient called back to follow up on this because she still hasn't heard anything. She would like a call to update her on what's going on. She said the last she heard was Dr Stephanie [name] was going to talk to Dr Layla Barter. Please advise

## 2023-04-04 ENCOUNTER — Other Ambulatory Visit (INDEPENDENT_AMBULATORY_CARE_PROVIDER_SITE_OTHER): Payer: BC Managed Care – PPO | Admitting: Plastic Surgery

## 2023-04-04 DIAGNOSIS — Q8500 Neurofibromatosis, unspecified: Secondary | ICD-10-CM

## 2023-04-04 NOTE — Progress Notes (Signed)
MRI for pre -operative planning

## 2023-04-05 NOTE — Telephone Encounter (Signed)
I talked to her yesterday. She has an MRI ordered. She will F/U with me after it is done.

## 2023-04-12 ENCOUNTER — Ambulatory Visit (HOSPITAL_COMMUNITY)
Admission: RE | Admit: 2023-04-12 | Discharge: 2023-04-12 | Disposition: A | Discharge status: Home / Self Care | Payer: BC Managed Care – PPO | Source: Ambulatory Visit | Attending: Plastic Surgery | Admitting: Plastic Surgery

## 2023-04-12 DIAGNOSIS — R220 Localized swelling, mass and lump, head: Secondary | ICD-10-CM | POA: Diagnosis not present

## 2023-04-12 DIAGNOSIS — Q8500 Neurofibromatosis, unspecified: Secondary | ICD-10-CM | POA: Diagnosis not present

## 2023-04-12 DIAGNOSIS — M898X9 Other specified disorders of bone, unspecified site: Secondary | ICD-10-CM | POA: Diagnosis not present

## 2023-04-12 DIAGNOSIS — I639 Cerebral infarction, unspecified: Secondary | ICD-10-CM | POA: Diagnosis not present

## 2023-04-12 MED ORDER — GADOBUTROL 1 MMOL/ML IV SOLN
5.0000 mL | Freq: Once | INTRAVENOUS | Status: AC | PRN
  Administered 2023-04-12: 5 mL via INTRAVENOUS

## 2023-04-13 ENCOUNTER — Telehealth (INDEPENDENT_AMBULATORY_CARE_PROVIDER_SITE_OTHER): Payer: Self-pay | Admitting: Plastic Surgery

## 2023-04-13 DIAGNOSIS — Q8500 Neurofibromatosis, unspecified: Secondary | ICD-10-CM

## 2023-04-13 NOTE — Telephone Encounter (Signed)
Pt called and wanted Dr Ladona Ridgel know that she had completed her MRI

## 2023-04-27 ENCOUNTER — Telehealth: Payer: Self-pay | Admitting: Plastic Surgery

## 2023-04-30 NOTE — Addendum Note (Signed)
Addended by: Weyman Croon on: 04/30/2023 08:44 AM   Modules accepted: Orders, Level of Service

## 2023-05-02 NOTE — Telephone Encounter (Signed)
Telephone encounter was routed to [facility] staff in error. Dr. Ladona Ridgel please advise how you would like to move forward with patient. She completed her MRI on 04/12/23.   Thanks.

## 2023-05-03 NOTE — Telephone Encounter (Signed)
I talked with Ms Stephanie [name]. She has a consult to [facility] for treatment and evaluation.

## 2023-05-16 ENCOUNTER — Telehealth: Payer: Self-pay | Admitting: *Deleted

## 2023-05-16 NOTE — Telephone Encounter (Signed)
Called and spoke with the patient,and she stated that she had not heard from the provider at [facility]    She asked if she could have the provider's name and number.  Informed the patient that the provider is (Dr. [phone]-#[phone]).  Patient verbalized understanding and agreed.//AB/CMA

## 2023-05-24 ENCOUNTER — Telehealth: Payer: Self-pay | Admitting: Plastic Surgery

## 2023-05-24 NOTE — Telephone Encounter (Signed)
Pt called and says she has talked to Dr Ladona Ridgel about getting an appt, She says she is being told that [facility] would be a better a option. She wants to know what to do. She is requesting a call back from Dr Ladona Ridgel.

## 2023-05-25 NOTE — Telephone Encounter (Signed)
It has been ~4 months since her consult and it sounds like they should either have a scheduled in-person or telephone follow-up with Dr. Ladona Ridgel to discuss next steps.

## 2023-05-30 NOTE — Telephone Encounter (Signed)
Na

## 2023-06-29 ENCOUNTER — Other Ambulatory Visit: Payer: Self-pay | Admitting: Hematology & Oncology

## 2023-06-29 DIAGNOSIS — C49A3 Gastrointestinal stromal tumor of small intestine: Secondary | ICD-10-CM

## 2023-07-09 ENCOUNTER — Ambulatory Visit: Payer: BC Managed Care – PPO | Admitting: Plastic Surgery

## 2023-07-09 ENCOUNTER — Encounter: Payer: Self-pay | Admitting: Plastic Surgery

## 2023-07-09 VITALS — BP 163/68 | HR 87 | Ht <= 58 in | Wt 117.0 lb

## 2023-07-09 DIAGNOSIS — Q8500 Neurofibromatosis, unspecified: Secondary | ICD-10-CM | POA: Diagnosis not present

## 2023-07-09 NOTE — Progress Notes (Signed)
Stephanie [name] returns today to discuss her MRI and options. She states that [facility] has told her that they cannot help her. She would like a referral to another facility.  Her tumor has grown slightly since I las saw her. It now presses against her glasses.  Her MRI shows extensive involvement of the left orbit.  Neurofibroma: I reiterated that I cannot help her. She understands.   Will send referrals to [facility] for eval and treatment.  I spent 20 minutes reviewing her chart, discussing findings, coordinating care, and documenting.

## 2023-07-30 ENCOUNTER — Other Ambulatory Visit: Payer: Self-pay | Admitting: Hematology & Oncology

## 2023-07-30 DIAGNOSIS — C49A3 Gastrointestinal stromal tumor of small intestine: Secondary | ICD-10-CM

## 2023-08-31 ENCOUNTER — Other Ambulatory Visit: Payer: Self-pay | Admitting: Hematology & Oncology

## 2023-08-31 DIAGNOSIS — C49A3 Gastrointestinal stromal tumor of small intestine: Secondary | ICD-10-CM

## 2023-09-24 ENCOUNTER — Other Ambulatory Visit (HOSPITAL_COMMUNITY): Payer: Self-pay | Admitting: Family Medicine

## 2023-09-24 DIAGNOSIS — Z1231 Encounter for screening mammogram for malignant neoplasm of breast: Secondary | ICD-10-CM

## 2023-09-26 ENCOUNTER — Ambulatory Visit (HOSPITAL_COMMUNITY)
Admission: RE | Admit: 2023-09-26 | Discharge: 2023-09-26 | Disposition: A | Discharge status: Home / Self Care | Payer: BC Managed Care – PPO | Source: Ambulatory Visit | Attending: Family Medicine | Admitting: Family Medicine

## 2023-09-26 ENCOUNTER — Encounter (HOSPITAL_COMMUNITY): Payer: Self-pay

## 2023-09-26 DIAGNOSIS — Z1231 Encounter for screening mammogram for malignant neoplasm of breast: Secondary | ICD-10-CM

## 2023-10-04 ENCOUNTER — Other Ambulatory Visit: Payer: Self-pay | Admitting: Hematology & Oncology

## 2023-10-04 DIAGNOSIS — C49A3 Gastrointestinal stromal tumor of small intestine: Secondary | ICD-10-CM

## 2023-11-05 ENCOUNTER — Other Ambulatory Visit: Payer: Self-pay | Admitting: Hematology & Oncology

## 2023-11-05 DIAGNOSIS — C49A3 Gastrointestinal stromal tumor of small intestine: Secondary | ICD-10-CM

## 2023-11-21 ENCOUNTER — Other Ambulatory Visit: Payer: Self-pay | Admitting: Medical Oncology

## 2023-11-21 DIAGNOSIS — C49A3 Gastrointestinal stromal tumor of small intestine: Secondary | ICD-10-CM

## 2023-11-22 ENCOUNTER — Inpatient Hospital Stay: Payer: BC Managed Care – PPO | Attending: Hematology & Oncology

## 2023-11-22 ENCOUNTER — Encounter: Payer: Self-pay | Admitting: Medical Oncology

## 2023-11-22 ENCOUNTER — Other Ambulatory Visit: Payer: BC Managed Care – PPO

## 2023-11-22 ENCOUNTER — Inpatient Hospital Stay (HOSPITAL_BASED_OUTPATIENT_CLINIC_OR_DEPARTMENT_OTHER): Payer: BC Managed Care – PPO | Admitting: Medical Oncology

## 2023-11-22 ENCOUNTER — Ambulatory Visit (HOSPITAL_BASED_OUTPATIENT_CLINIC_OR_DEPARTMENT_OTHER)
Admission: RE | Admit: 2023-11-22 | Discharge: 2023-11-22 | Disposition: A | Discharge status: Home / Self Care | Payer: BC Managed Care – PPO | Source: Ambulatory Visit | Attending: Medical Oncology | Admitting: Medical Oncology

## 2023-11-22 ENCOUNTER — Ambulatory Visit: Payer: BC Managed Care – PPO | Admitting: Medical Oncology

## 2023-11-22 ENCOUNTER — Encounter (HOSPITAL_BASED_OUTPATIENT_CLINIC_OR_DEPARTMENT_OTHER): Payer: Self-pay

## 2023-11-22 VITALS — BP 135/69 | HR 87 | Temp 98.2°F | Resp 18 | Ht <= 58 in | Wt 111.0 lb

## 2023-11-22 DIAGNOSIS — C49A3 Gastrointestinal stromal tumor of small intestine: Secondary | ICD-10-CM | POA: Insufficient documentation

## 2023-11-22 DIAGNOSIS — K760 Fatty (change of) liver, not elsewhere classified: Secondary | ICD-10-CM | POA: Diagnosis not present

## 2023-11-22 DIAGNOSIS — E049 Nontoxic goiter, unspecified: Secondary | ICD-10-CM | POA: Diagnosis not present

## 2023-11-22 DIAGNOSIS — C49A0 Gastrointestinal stromal tumor, unspecified site: Secondary | ICD-10-CM | POA: Diagnosis not present

## 2023-11-22 DIAGNOSIS — D649 Anemia, unspecified: Secondary | ICD-10-CM | POA: Diagnosis not present

## 2023-11-22 DIAGNOSIS — Z79899 Other long term (current) drug therapy: Secondary | ICD-10-CM | POA: Diagnosis not present

## 2023-11-22 LAB — CMP (CANCER CENTER ONLY)
ALT: 12 U/L (ref 0–44)
AST: 16 U/L (ref 15–41)
Albumin: 4.2 g/dL (ref 3.5–5.0)
Alkaline Phosphatase: 48 U/L (ref 38–126)
Anion gap: 6 (ref 5–15)
BUN: 10 mg/dL (ref 8–23)
CO2: 30 mmol/L (ref 22–32)
Calcium: 9.1 mg/dL (ref 8.9–10.3)
Chloride: 107 mmol/L (ref 98–111)
Creatinine: 0.77 mg/dL (ref 0.44–1.00)
GFR, Estimated: >60 mL/min (ref >60)
Glucose, Bld: 96 mg/dL (ref 70–99)
Potassium: 5 mmol/L (ref 3.5–5.1)
Sodium: 143 mmol/L (ref 135–145)
Total Bilirubin: 0.5 mg/dL (ref 0.0–1.2)
Total Protein: 6.2 g/dL — ABNORMAL LOW (ref 6.5–8.1)

## 2023-11-22 LAB — CBC WITH DIFFERENTIAL (CANCER CENTER ONLY)
Abs Immature Granulocytes: 0.1 10*3/uL — ABNORMAL HIGH (ref 0.00–0.07)
Basophils Absolute: 0.1 10*3/uL (ref 0.0–0.1)
Basophils Relative: 1 %
Eosinophils Absolute: 0.3 10*3/uL (ref 0.0–0.5)
Eosinophils Relative: 4 %
HCT: 36 % (ref 36.0–46.0)
Hemoglobin: 11.6 g/dL — ABNORMAL LOW (ref 12.0–15.0)
Immature Granulocytes: 1 %
Lymphocytes Relative: 18 %
Lymphs Abs: 1.3 10*3/uL (ref 0.7–4.0)
MCH: 31.2 pg (ref 26.0–34.0)
MCHC: 32.2 g/dL (ref 30.0–36.0)
MCV: 96.8 fL (ref 80.0–100.0)
Monocytes Absolute: 0.6 10*3/uL (ref 0.1–1.0)
Monocytes Relative: 8 %
Neutro Abs: 4.8 10*3/uL (ref 1.7–7.7)
Neutrophils Relative %: 68 %
Platelet Count: 252 10*3/uL (ref 150–400)
RBC: 3.72 MIL/uL — ABNORMAL LOW (ref 3.87–5.11)
RDW: 14.3 % (ref 11.5–15.5)
WBC Count: 7.1 10*3/uL (ref 4.0–10.5)
nRBC: 0 % (ref 0.0–0.2)

## 2023-11-22 LAB — LACTATE DEHYDROGENASE: LDH: 195 U/L — ABNORMAL HIGH (ref 98–192)

## 2023-11-22 MED ORDER — IOHEXOL 300 MG/ML  SOLN
100.0000 mL | Freq: Once | INTRAMUSCULAR | Status: AC | PRN
  Administered 2023-11-22: 100 mL via INTRAVENOUS

## 2023-11-22 NOTE — Progress Notes (Signed)
 Hematology and Oncology Follow Up Visit  JAELYNNE HOCKLEY [id number] August 07, 1956 68 y.o. 11/22/2023   Principle Diagnosis:  1. Gastrointestinal stromal tumors  2. Neurofibromatosis  Current Therapy:   Gleevec 400 mg po q daily     Interim History:  Ms.  Berni is back for followup.  She continues to be on Gleevec; she has been on this medication for over 10 years. She is tolerating this medication well without known side effects.  There has been no bleeding to her knowledge: denies epistaxis, gingivitis, hemoptysis, hematemesis, hematuria, melena, excessive bruising, blood donation.   She has had no problems with nausea or vomiting.  There is been no change in bowel or bladder habits.  She has had no cough or shortness of breath.  There is been no leg swelling.  She has had no bleeding.  Patient does have neurofibromatosis.  Thankfully, this has not been a problem for her.  She has lost a few pounds - she had that bad stomach bug about a month ago lost some weight with that.   Overall, I would say performance status is probably ECOG 1.    Wt Readings from Last 3 Encounters:  11/22/23 111 lb (50.3 kg)  07/09/23 117 lb (53.1 kg)  02/19/23 115 lb 12.8 oz (52.5 kg)    Medications:  Current Outpatient Medications:    acetaminophen (TYLENOL) 500 MG tablet, Take 1,000 mg by mouth every 6 (six) hours as needed for moderate pain., Disp: , Rfl:    alendronate (FOSAMAX) 70 MG tablet, Take 70 mg by mouth once a week., Disp: , Rfl:    CALCIUM-VITAMIN D PO, Take 1 tablet by mouth 2 (two) times daily. , Disp: , Rfl:    GLEEVEC 400 MG tablet, TAKE 1 TABLET BY MOUTH ONCE DAILY AT THE SAME TIME WITH FOOD AND A LARGE GLASS OF WATER. DO NOT CRUSH TABLETS. AVOID GRAPEFRUIT PRODUCTS., Disp: 30 tablet, Rfl: 0   meclizine (ANTIVERT) 25 MG tablet, Take 1 tablet by mouth every 6 (six) hours as needed., Disp: , Rfl:    melatonin 5 MG TABS, Take 5 mg by mouth at bedtime as needed (sleep)., Disp: , Rfl:     omeprazole (PRILOSEC) 20 MG capsule, Take 20 mg by mouth daily., Disp: , Rfl:    ondansetron (ZOFRAN-ODT) 4 MG disintegrating tablet, Take 4-8 mg by mouth every 6 (six) hours as needed., Disp: , Rfl:    Polyvinyl Alcohol-Povidone (REFRESH OP), Place 1 drop into both eyes as needed (dry eyes). , Disp: , Rfl:    rosuvastatin (CRESTOR) 10 MG tablet, Take 10 mg by mouth at bedtime., Disp: , Rfl:    zolpidem (AMBIEN) 10 MG tablet, Take 10 mg by mouth daily., Disp: , Rfl:   Allergies: No Known Allergies  Past Medical History, Surgical history, Social history, and Family History were reviewed and updated.  Review of Systems: Review of Systems  Constitutional: Negative.   HENT: Negative.    Eyes: Negative.   Respiratory: Negative.    Cardiovascular: Negative.   Gastrointestinal: Negative.   Genitourinary: Negative.   Musculoskeletal: Negative.   Skin: Negative.   Neurological: Negative.   Endo/Heme/Allergies: Negative.   Psychiatric/Behavioral: Negative.       Physical Exam:  height is 4\' 10"  (1.473 m) and weight is 111 lb (50.3 kg). Her oral temperature is 98.2 F (36.8 C). Her blood pressure is 135/69 and her pulse is 87. Her respiration is 18 and oxygen saturation is 100%.   Physical Exam  Vitals reviewed.  HENT:     Head: Normocephalic and atraumatic.  Eyes:     Comments: Right eye appears normal   Cardiovascular:     Rate and Rhythm: Normal rate and regular rhythm.     Heart sounds: Normal heart sounds.  Pulmonary:     Effort: Pulmonary effort is normal.     Breath sounds: Normal breath sounds.  Abdominal:     General: Bowel sounds are normal.     Palpations: Abdomen is soft.     Tenderness: There is no abdominal tenderness.     Comments: Abdominal exam is soft.  She has well-healed laparotomy scar.  She has no fluid wave.  There is no abdominal mass.  There is no palpable liver or spleen tip.  Musculoskeletal:        General: No tenderness or deformity. Normal range of  motion.     Cervical back: Normal range of motion.  Lymphadenopathy:     Cervical: No cervical adenopathy.  Skin:    General: Skin is warm and dry.     Findings: No erythema or rash.     Comments: Skin exam shows numerous neurofibromas.  Neurological:     Mental Status: She is alert and oriented to person, place, and time.  Psychiatric:        Behavior: Behavior normal.        Thought Content: Thought content normal.        Judgment: Judgment normal.      Lab Results  Component Value Date   WBC 7.1 11/22/2023   HGB 11.6 (L) 11/22/2023   HCT 36.0 11/22/2023   MCV 96.8 11/22/2023   PLT 252 11/22/2023     Chemistry      Component Value Date/Time   NA 143 11/22/2023 1043   NA 148 (H) 07/27/2017 0831   NA 141 12/28/2015 0955   K 5.0 11/22/2023 1043   K 3.8 07/27/2017 0831   K 4.2 12/28/2015 0955   CL 107 11/22/2023 1043   CL 109 (H) 07/27/2017 0831   CO2 30 11/22/2023 1043   CO2 29 07/27/2017 0831   CO2 27 12/28/2015 0955   BUN 10 11/22/2023 1043   BUN 10 07/27/2017 0831   BUN 12.7 12/28/2015 0955   CREATININE 0.77 11/22/2023 1043   CREATININE 0.8 07/27/2017 0831   CREATININE 0.7 12/28/2015 0955      Component Value Date/Time   CALCIUM 9.1 11/22/2023 1043   CALCIUM 9.2 07/27/2017 0831   CALCIUM 9.0 12/28/2015 0955   ALKPHOS 48 11/22/2023 1043   ALKPHOS 68 07/27/2017 0831   ALKPHOS 53 12/28/2015 0955   AST 16 11/22/2023 1043   AST 14 12/28/2015 0955   ALT 12 11/22/2023 1043   ALT 19 07/27/2017 0831   ALT 14 12/28/2015 0955   BILITOT 0.5 11/22/2023 1043   BILITOT 0.33 12/28/2015 0955     Encounter Diagnosis  Name Primary?   Malignant GIST (gastrointestinal stromal tumor) of small intestine (HCC) Yes    Impression and Plan: Ms. Boerner is 69 year old female with a GIST. This is metastatic. She did have resection of  tumor that was symptomatic. Her surgery was back in June 2015.   Today exam looks stable. I am happy that she is tolerating the Gleevec  well. She does have mild anemia which may be secondary to her recent stomach virus. She does have follow up with her PCP in a few weeks. She will have labs repeated at this visit. (She  prefers this to coming here in 2-3 months for a lab only visit). If they continue to be abnormal I would suggest further work up.  Will also need to watch her weight to ensure it is stable CT scan results pending.   RTC 9 months MD, labs(CBC w/, CMP, LDH), CT scan same day   Rushie Chestnut, PA-C 4/10/20252:03 PM

## 2023-12-07 ENCOUNTER — Other Ambulatory Visit: Payer: Self-pay | Admitting: Hematology & Oncology

## 2023-12-07 DIAGNOSIS — C49A3 Gastrointestinal stromal tumor of small intestine: Secondary | ICD-10-CM

## 2023-12-20 DIAGNOSIS — E559 Vitamin D deficiency, unspecified: Secondary | ICD-10-CM | POA: Diagnosis not present

## 2023-12-20 DIAGNOSIS — C49A3 Gastrointestinal stromal tumor of small intestine: Secondary | ICD-10-CM | POA: Diagnosis not present

## 2023-12-20 DIAGNOSIS — K219 Gastro-esophageal reflux disease without esophagitis: Secondary | ICD-10-CM | POA: Diagnosis not present

## 2023-12-20 DIAGNOSIS — E782 Mixed hyperlipidemia: Secondary | ICD-10-CM | POA: Diagnosis not present

## 2023-12-20 DIAGNOSIS — Z6823 Body mass index (BMI) 23.0-23.9, adult: Secondary | ICD-10-CM | POA: Diagnosis not present

## 2023-12-20 DIAGNOSIS — E7849 Other hyperlipidemia: Secondary | ICD-10-CM | POA: Diagnosis not present

## 2023-12-20 DIAGNOSIS — M810 Age-related osteoporosis without current pathological fracture: Secondary | ICD-10-CM | POA: Diagnosis not present

## 2023-12-20 DIAGNOSIS — E538 Deficiency of other specified B group vitamins: Secondary | ICD-10-CM | POA: Diagnosis not present

## 2023-12-20 DIAGNOSIS — Q8500 Neurofibromatosis, unspecified: Secondary | ICD-10-CM | POA: Diagnosis not present

## 2023-12-20 DIAGNOSIS — Z0001 Encounter for general adult medical examination with abnormal findings: Secondary | ICD-10-CM | POA: Diagnosis not present

## 2023-12-20 DIAGNOSIS — Z1331 Encounter for screening for depression: Secondary | ICD-10-CM | POA: Diagnosis not present

## 2024-01-11 ENCOUNTER — Other Ambulatory Visit: Payer: Self-pay | Admitting: Hematology & Oncology

## 2024-01-11 DIAGNOSIS — C49A3 Gastrointestinal stromal tumor of small intestine: Secondary | ICD-10-CM

## 2024-02-18 ENCOUNTER — Other Ambulatory Visit: Payer: Self-pay | Admitting: Hematology & Oncology

## 2024-02-18 DIAGNOSIS — C49A3 Gastrointestinal stromal tumor of small intestine: Secondary | ICD-10-CM

## 2024-02-28 DIAGNOSIS — H6501 Acute serous otitis media, right ear: Secondary | ICD-10-CM | POA: Diagnosis not present

## 2024-02-28 DIAGNOSIS — Z6824 Body mass index (BMI) 24.0-24.9, adult: Secondary | ICD-10-CM | POA: Diagnosis not present

## 2024-03-14 ENCOUNTER — Telehealth: Payer: Self-pay

## 2024-03-14 ENCOUNTER — Other Ambulatory Visit (HOSPITAL_COMMUNITY): Payer: Self-pay

## 2024-03-14 NOTE — Telephone Encounter (Signed)
 Oral Oncology Patient Advocate Encounter   Received notification that a prior authorization Renewal for Gleevec  is required.   PA submitted on 03/14/24 Key [id number] Status is pending      Charlott Hamilton,  CPhT-Adv  she/her/hers [facility]  [facility] Pharmacy Technician Patient Advocate Specialist III [facility] Phone: [phone]  Fax: [phone] [email or web address]

## 2024-03-17 ENCOUNTER — Other Ambulatory Visit (HOSPITAL_COMMUNITY): Payer: Self-pay

## 2024-03-17 NOTE — Telephone Encounter (Signed)
 Oral Oncology Patient Advocate Encounter  Prior Authorization  Renewal for Imatinib  has been approved.    PA# : [id number] Effective dates: 03/17/24 - N/A Patient Must Fill at  Preferred outside pharmacy per BC/BS   Charlott Hamilton,  CPhT-Adv  she/her/hers [facility]  [facility] Pharmacy Technician Patient Advocate Specialist III [facility] Phone: [phone]  Fax: [phone] [email or web address]

## 2024-03-21 ENCOUNTER — Other Ambulatory Visit: Payer: Self-pay | Admitting: Hematology & Oncology

## 2024-03-21 DIAGNOSIS — C49A3 Gastrointestinal stromal tumor of small intestine: Secondary | ICD-10-CM

## 2024-03-31 DIAGNOSIS — Z6824 Body mass index (BMI) 24.0-24.9, adult: Secondary | ICD-10-CM | POA: Diagnosis not present

## 2024-03-31 DIAGNOSIS — B029 Zoster without complications: Secondary | ICD-10-CM | POA: Diagnosis not present

## 2024-04-10 ENCOUNTER — Other Ambulatory Visit: Payer: Self-pay

## 2024-04-10 DIAGNOSIS — C49A3 Gastrointestinal stromal tumor of small intestine: Secondary | ICD-10-CM

## 2024-04-10 MED ORDER — GLEEVEC 400 MG PO TABS: ORAL_TABLET | ORAL | 0 refills | Status: DC

## 2024-04-15 ENCOUNTER — Other Ambulatory Visit: Payer: Self-pay

## 2024-04-15 DIAGNOSIS — C49A3 Gastrointestinal stromal tumor of small intestine: Secondary | ICD-10-CM

## 2024-04-15 MED ORDER — GLEEVEC 400 MG PO TABS: ORAL_TABLET | ORAL | 0 refills | Status: DC

## 2024-04-28 ENCOUNTER — Other Ambulatory Visit: Payer: Self-pay | Admitting: Hematology & Oncology

## 2024-04-28 DIAGNOSIS — C49A3 Gastrointestinal stromal tumor of small intestine: Secondary | ICD-10-CM

## 2024-05-28 ENCOUNTER — Other Ambulatory Visit: Payer: Self-pay | Admitting: Hematology & Oncology

## 2024-05-28 DIAGNOSIS — C49A3 Gastrointestinal stromal tumor of small intestine: Secondary | ICD-10-CM

## 2024-06-30 ENCOUNTER — Other Ambulatory Visit: Payer: Self-pay | Admitting: Hematology & Oncology

## 2024-06-30 DIAGNOSIS — C49A3 Gastrointestinal stromal tumor of small intestine: Secondary | ICD-10-CM

## 2024-07-04 ENCOUNTER — Other Ambulatory Visit: Payer: Self-pay | Admitting: Hematology & Oncology

## 2024-07-04 DIAGNOSIS — C49A3 Gastrointestinal stromal tumor of small intestine: Secondary | ICD-10-CM

## 2024-07-23 ENCOUNTER — Other Ambulatory Visit: Payer: Self-pay | Admitting: Hematology & Oncology

## 2024-07-23 DIAGNOSIS — C49A3 Gastrointestinal stromal tumor of small intestine: Secondary | ICD-10-CM

## 2024-08-15 ENCOUNTER — Other Ambulatory Visit: Payer: Self-pay | Admitting: *Deleted

## 2024-08-15 DIAGNOSIS — C49A3 Gastrointestinal stromal tumor of small intestine: Secondary | ICD-10-CM

## 2024-08-18 ENCOUNTER — Inpatient Hospital Stay (HOSPITAL_BASED_OUTPATIENT_CLINIC_OR_DEPARTMENT_OTHER): Admitting: Hematology & Oncology

## 2024-08-18 ENCOUNTER — Encounter: Payer: Self-pay | Admitting: Hematology & Oncology

## 2024-08-18 ENCOUNTER — Inpatient Hospital Stay: Attending: Hematology & Oncology

## 2024-08-18 ENCOUNTER — Other Ambulatory Visit: Payer: Self-pay

## 2024-08-18 ENCOUNTER — Ambulatory Visit (HOSPITAL_BASED_OUTPATIENT_CLINIC_OR_DEPARTMENT_OTHER)
Admission: RE | Admit: 2024-08-18 | Discharge: 2024-08-18 | Disposition: A | Discharge status: Home / Self Care | Source: Ambulatory Visit | Attending: Medical Oncology | Admitting: Medical Oncology

## 2024-08-18 ENCOUNTER — Encounter (HOSPITAL_BASED_OUTPATIENT_CLINIC_OR_DEPARTMENT_OTHER): Payer: Self-pay

## 2024-08-18 VITALS — BP 148/64 | HR 81 | Temp 97.8°F | Resp 16 | Ht 59.0 in | Wt 115.0 lb

## 2024-08-18 DIAGNOSIS — Q8500 Neurofibromatosis, unspecified: Secondary | ICD-10-CM | POA: Insufficient documentation

## 2024-08-18 DIAGNOSIS — Z79899 Other long term (current) drug therapy: Secondary | ICD-10-CM | POA: Insufficient documentation

## 2024-08-18 DIAGNOSIS — Z7983 Long term (current) use of bisphosphonates: Secondary | ICD-10-CM | POA: Insufficient documentation

## 2024-08-18 DIAGNOSIS — C49A0 Gastrointestinal stromal tumor, unspecified site: Secondary | ICD-10-CM | POA: Insufficient documentation

## 2024-08-18 DIAGNOSIS — C49A3 Gastrointestinal stromal tumor of small intestine: Secondary | ICD-10-CM | POA: Insufficient documentation

## 2024-08-18 LAB — CBC WITH DIFFERENTIAL (CANCER CENTER ONLY)
Abs Immature Granulocytes: 0.04 K/uL (ref 0.00–0.07)
Basophils Absolute: 0.1 K/uL (ref 0.0–0.1)
Basophils Relative: 1 %
Eosinophils Absolute: 0.5 K/uL (ref 0.0–0.5)
Eosinophils Relative: 8 %
HCT: 36 % (ref 36.0–46.0)
Hemoglobin: 11.6 g/dL — ABNORMAL LOW (ref 12.0–15.0)
Immature Granulocytes: 1 %
Lymphocytes Relative: 18 %
Lymphs Abs: 1.2 K/uL (ref 0.7–4.0)
MCH: 30.9 pg (ref 26.0–34.0)
MCHC: 32.2 g/dL (ref 30.0–36.0)
MCV: 95.7 fL (ref 80.0–100.0)
Monocytes Absolute: 0.4 K/uL (ref 0.1–1.0)
Monocytes Relative: 6 %
Neutro Abs: 4.5 K/uL (ref 1.7–7.7)
Neutrophils Relative %: 66 %
Platelet Count: 174 K/uL (ref 150–400)
RBC: 3.76 MIL/uL — ABNORMAL LOW (ref 3.87–5.11)
RDW: 13.8 % (ref 11.5–15.5)
WBC Count: 6.7 K/uL (ref 4.0–10.5)
nRBC: 0 % (ref 0.0–0.2)

## 2024-08-18 LAB — COMPREHENSIVE METABOLIC PANEL WITH GFR
ALT: 13 U/L (ref 0–44)
AST: 20 U/L (ref 15–41)
Albumin: 4.3 g/dL (ref 3.5–5.0)
Alkaline Phosphatase: 53 U/L (ref 38–126)
Anion gap: 7 (ref 5–15)
BUN: 12 mg/dL (ref 8–23)
CO2: 28 mmol/L (ref 22–32)
Calcium: 9.2 mg/dL (ref 8.9–10.3)
Chloride: 109 mmol/L (ref 98–111)
Creatinine, Ser: 0.71 mg/dL (ref 0.44–1.00)
GFR, Estimated: >60 mL/min
Glucose, Bld: 96 mg/dL (ref 70–99)
Potassium: 5.5 mmol/L — ABNORMAL HIGH (ref 3.5–5.1)
Sodium: 144 mmol/L (ref 135–145)
Total Bilirubin: 0.3 mg/dL (ref 0.0–1.2)
Total Protein: 6.1 g/dL — ABNORMAL LOW (ref 6.5–8.1)

## 2024-08-18 LAB — LACTATE DEHYDROGENASE: LDH: 231 U/L (ref 105–235)

## 2024-08-18 MED ORDER — IOHEXOL 300 MG/ML  SOLN
100.0000 mL | Freq: Once | INTRAMUSCULAR | Status: AC | PRN
  Administered 2024-08-18: 100 mL via INTRAVENOUS

## 2024-08-18 NOTE — Progress Notes (Signed)
 " Hematology and Oncology Follow Up Visit  Stephanie [name] [id number] 1956/01/04 69 y.o. 08/18/2024   Principle Diagnosis:  1. Gastrointestinal stromal tumors  2. Neurofibromatosis  Current Therapy:   Gleevec  400 mg po q daily     Interim History:  [name] is back for followup.  We last saw her back in April 2025.  Since we last saw her, she been doing okay.  Her main complaint has been losing hair.  She thinks is from the Gleevec .  She really would like to come off the Gleevec  if possible.  She works at a nursing home.  She was finally able to get off on Christmas and New Years.  This year, she wants to go to part-time.  I think this is fantastic.  She has been at the nursing home for decades.  She deserves to work part-time and be able to spend more time with her family.  She has had no problems with bowels or bladder.  She has had no cough or shortness of breath.  She has had no issues with The flu or COVID.  She had a CT scan that was done today.  The results are not back yet.  She has had no leg swelling.  She has had no bleeding.  There is been no rashes.  Overall, I would say that her performance status is probably ECOG 1.   Wt Readings from Last 3 Encounters:  08/18/24 115 lb (52.2 kg)  11/22/23 111 lb (50.3 kg)  07/09/23 117 lb (53.1 kg)    Medications:  Current Outpatient Medications:    valACYclovir (VALTREX) 1000 MG tablet, Take 1,000 mg by mouth 3 (three) times daily., Disp: , Rfl:    acetaminophen  (TYLENOL ) 500 MG tablet, Take 1,000 mg by mouth every 6 (six) hours as needed for moderate pain., Disp: , Rfl:    alendronate (FOSAMAX) 70 MG tablet, Take 70 mg by mouth once a week., Disp: , Rfl:    CALCIUM -VITAMIN D PO, Take 1 tablet by mouth 2 (two) times daily. , Disp: , Rfl:    gabapentin (NEURONTIN) 100 MG capsule, Take 100 mg by mouth 3 (three) times daily., Disp: , Rfl:    imatinib  (GLEEVEC ) 400 MG tablet, TAKE 1 TABLET BY MOUTH 1 TIME A DAY AT THE SAME TIME  WITH FOOD AND A LARGE GLASS OF WATER . DO NOT CRUSH TABLETS. AVOID GRAPEFRUIT PRODUCTS., Disp: 30 tablet, Rfl: 0   melatonin 5 MG TABS, Take 5 mg by mouth at bedtime as needed (sleep)., Disp: , Rfl:    omeprazole (PRILOSEC) 20 MG capsule, Take 20 mg by mouth daily., Disp: , Rfl:    Polyvinyl Alcohol -Povidone (REFRESH OP), Place 1 drop into both eyes as needed (dry eyes). , Disp: , Rfl:    rosuvastatin (CRESTOR) 10 MG tablet, Take 10 mg by mouth at bedtime., Disp: , Rfl:    zolpidem (AMBIEN) 10 MG tablet, Take 10 mg by mouth daily., Disp: , Rfl:   Allergies: No Known Allergies  Past Medical History, Surgical history, Social history, and Family History were reviewed and updated.  Review of Systems: Review of Systems  Constitutional: Negative.   HENT: Negative.    Eyes: Negative.   Respiratory: Negative.    Cardiovascular: Negative.   Gastrointestinal: Negative.   Genitourinary: Negative.   Musculoskeletal: Negative.   Skin: Negative.   Neurological: Negative.   Endo/Heme/Allergies: Negative.   Psychiatric/Behavioral: Negative.       Physical Exam:  height is 4'  11 (1.499 m) and weight is 115 lb (52.2 kg). Her oral temperature is 97.8 F (36.6 C). Her blood pressure is 148/64 (abnormal) and her pulse is 81. Her respiration is 16 and oxygen saturation is 100%.   Physical Exam Vitals reviewed.  HENT:     Head: Normocephalic and atraumatic.  Eyes:     Comments: Right eye appears normal   Cardiovascular:     Rate and Rhythm: Normal rate and regular rhythm.     Heart sounds: Normal heart sounds.  Pulmonary:     Effort: Pulmonary effort is normal.     Breath sounds: Normal breath sounds.  Abdominal:     General: Bowel sounds are normal.     Palpations: Abdomen is soft.     Tenderness: There is no abdominal tenderness.     Comments: Abdominal exam is soft.  She has well-healed laparotomy scar.  She has no fluid wave.  There is no abdominal mass.  There is no palpable liver or  spleen tip.  Musculoskeletal:        General: No tenderness or deformity. Normal range of motion.     Cervical back: Normal range of motion.  Lymphadenopathy:     Cervical: No cervical adenopathy.  Skin:    General: Skin is warm and dry.     Findings: No erythema or rash.     Comments: Skin exam shows numerous neurofibromas.  Neurological:     Mental Status: She is alert and oriented to person, place, and time.  Psychiatric:        Behavior: Behavior normal.        Thought Content: Thought content normal.        Judgment: Judgment normal.      Lab Results  Component Value Date   WBC 6.7 08/18/2024   HGB 11.6 (L) 08/18/2024   HCT 36.0 08/18/2024   MCV 95.7 08/18/2024   PLT 174 08/18/2024     Chemistry      Component Value Date/Time   NA 144 08/18/2024 0737   NA 148 (H) 07/27/2017 0831   NA 141 12/28/2015 0955   K 5.5 (H) 08/18/2024 0737   K 3.8 07/27/2017 0831   K 4.2 12/28/2015 0955   CL 109 08/18/2024 0737   CL 109 (H) 07/27/2017 0831   CO2 28 08/18/2024 0737   CO2 29 07/27/2017 0831   CO2 27 12/28/2015 0955   BUN 12 08/18/2024 0737   BUN 10 07/27/2017 0831   BUN 12.7 12/28/2015 0955   CREATININE 0.71 08/18/2024 0737   CREATININE 0.77 11/22/2023 1043   CREATININE 0.8 07/27/2017 0831   CREATININE 0.7 12/28/2015 0955      Component Value Date/Time   CALCIUM  9.2 08/18/2024 0737   CALCIUM  9.2 07/27/2017 0831   CALCIUM  9.0 12/28/2015 0955   ALKPHOS 53 08/18/2024 0737   ALKPHOS 68 07/27/2017 0831   ALKPHOS 53 12/28/2015 0955   AST 20 08/18/2024 0737   AST 16 11/22/2023 1043   AST 14 12/28/2015 0955   ALT 13 08/18/2024 0737   ALT 12 11/22/2023 1043   ALT 19 07/27/2017 0831   ALT 14 12/28/2015 0955   BILITOT 0.3 08/18/2024 0737   BILITOT 0.5 11/22/2023 1043   BILITOT 0.33 12/28/2015 0955        Impression and Plan: Stephanie [name] is 69 year old female with a GIST. This is metastatic. She did have resection of  tumor that was symptomatic. Her surgery was  back in June 2015.  Again, she wants to come off the Gleevec .  We will see what the CT scan shows.  If the CT scan does not show anything that looks like active disease, then maybe we can get her off Gleevec  and see if her hair is better.  If we get her off Gleevec , we will have to follow her closely.  I probably would like to have her come back in 3 months.  I think we probably need more often scans if she comes off Gleevec .  I am just happy that it she is going be able to go part-time.  I know this is important for her.  For right now, we will plan to have her come back in 3 months.    Maude JONELLE Crease, MD 1/5/20262:37 PM "

## 2024-08-29 ENCOUNTER — Other Ambulatory Visit: Payer: Self-pay | Admitting: Hematology & Oncology

## 2024-08-29 DIAGNOSIS — C49A3 Gastrointestinal stromal tumor of small intestine: Secondary | ICD-10-CM

## 2024-09-05 ENCOUNTER — Ambulatory Visit: Payer: Self-pay | Admitting: Hematology & Oncology

## 2024-09-05 NOTE — Telephone Encounter (Signed)
 Advised via MyChart.

## 2024-09-05 NOTE — Telephone Encounter (Signed)
-----   Message from Maude Crease, MD sent at 09/05/2024  4:47 PM EST ----- Please call her and let her know that the CT scan looks okay.  Nothing looks active.  She can come off the Gleevec .  Please make sure that I have an appointment to see her back in 3 months.  Thanks.   Jeralyn ----- Message ----- From: Franchot Lauraine HERO, NP Sent: 09/05/2024   1:48 PM EST To: Maude JONELLE Crease, MD

## 2024-09-10 ENCOUNTER — Other Ambulatory Visit (HOSPITAL_COMMUNITY): Payer: Self-pay | Admitting: Family Medicine

## 2024-09-10 DIAGNOSIS — Z1231 Encounter for screening mammogram for malignant neoplasm of breast: Secondary | ICD-10-CM

## 2024-09-29 ENCOUNTER — Ambulatory Visit (HOSPITAL_COMMUNITY)

## 2024-11-20 ENCOUNTER — Inpatient Hospital Stay

## 2024-11-20 ENCOUNTER — Inpatient Hospital Stay: Admitting: Hematology & Oncology
